# Patient Record
Sex: Male | Born: 1965 | Race: Black or African American | Hispanic: No | Marital: Married | Smoking: Former smoker
Health system: Southern US, Community
[De-identification: ages and names within clinical notes are randomized; demographics above are authoritative.]

## PROBLEM LIST (undated history)

## (undated) DIAGNOSIS — I639 Cerebral infarction, unspecified: Secondary | ICD-10-CM

## (undated) DIAGNOSIS — E785 Hyperlipidemia, unspecified: Secondary | ICD-10-CM

## (undated) DIAGNOSIS — I10 Essential (primary) hypertension: Secondary | ICD-10-CM

## (undated) HISTORY — DX: Cerebral infarction, unspecified: I63.9

## (undated) HISTORY — DX: Hyperlipidemia, unspecified: E78.5

## (undated) HISTORY — DX: Essential (primary) hypertension: I10

---

## 2007-09-27 ENCOUNTER — Emergency Department (HOSPITAL_COMMUNITY): Admission: EM | Admit: 2007-09-27 | Discharge: 2007-09-27 | Payer: Self-pay | Admitting: Family Medicine

## 2014-04-07 ENCOUNTER — Encounter (HOSPITAL_COMMUNITY): Payer: Self-pay | Admitting: Emergency Medicine

## 2014-04-07 ENCOUNTER — Emergency Department (HOSPITAL_COMMUNITY): Payer: BLUE CROSS/BLUE SHIELD

## 2014-04-07 ENCOUNTER — Inpatient Hospital Stay (HOSPITAL_COMMUNITY)
Admission: EM | Admit: 2014-04-07 | Discharge: 2014-04-11 | DRG: 069 | Disposition: A | Discharge status: Home / Self Care | Payer: BLUE CROSS/BLUE SHIELD | Attending: Neurology | Admitting: Neurology

## 2014-04-07 DIAGNOSIS — Z823 Family history of stroke: Secondary | ICD-10-CM | POA: Diagnosis not present

## 2014-04-07 DIAGNOSIS — Z8249 Family history of ischemic heart disease and other diseases of the circulatory system: Secondary | ICD-10-CM

## 2014-04-07 DIAGNOSIS — R030 Elevated blood-pressure reading, without diagnosis of hypertension: Secondary | ICD-10-CM

## 2014-04-07 DIAGNOSIS — Z833 Family history of diabetes mellitus: Secondary | ICD-10-CM | POA: Diagnosis not present

## 2014-04-07 DIAGNOSIS — IMO0001 Reserved for inherently not codable concepts without codable children: Secondary | ICD-10-CM | POA: Diagnosis present

## 2014-04-07 DIAGNOSIS — G451 Carotid artery syndrome (hemispheric): Secondary | ICD-10-CM | POA: Diagnosis not present

## 2014-04-07 DIAGNOSIS — Z720 Tobacco use: Secondary | ICD-10-CM | POA: Diagnosis present

## 2014-04-07 DIAGNOSIS — R4781 Slurred speech: Secondary | ICD-10-CM | POA: Diagnosis present

## 2014-04-07 DIAGNOSIS — G8194 Hemiplegia, unspecified affecting left nondominant side: Secondary | ICD-10-CM | POA: Diagnosis present

## 2014-04-07 DIAGNOSIS — I10 Essential (primary) hypertension: Secondary | ICD-10-CM | POA: Diagnosis present

## 2014-04-07 DIAGNOSIS — F17290 Nicotine dependence, other tobacco product, uncomplicated: Secondary | ICD-10-CM | POA: Diagnosis present

## 2014-04-07 DIAGNOSIS — G459 Transient cerebral ischemic attack, unspecified: Secondary | ICD-10-CM | POA: Diagnosis present

## 2014-04-07 DIAGNOSIS — I639 Cerebral infarction, unspecified: Secondary | ICD-10-CM | POA: Diagnosis present

## 2014-04-07 DIAGNOSIS — E785 Hyperlipidemia, unspecified: Secondary | ICD-10-CM | POA: Diagnosis present

## 2014-04-07 DIAGNOSIS — Z7982 Long term (current) use of aspirin: Secondary | ICD-10-CM | POA: Diagnosis not present

## 2014-04-07 DIAGNOSIS — R29810 Facial weakness: Secondary | ICD-10-CM | POA: Diagnosis present

## 2014-04-07 DIAGNOSIS — Z79899 Other long term (current) drug therapy: Secondary | ICD-10-CM

## 2014-04-07 LAB — COMPREHENSIVE METABOLIC PANEL
ALT: 20 U/L (ref 0–53)
AST: 24 U/L (ref 0–37)
Albumin: 3.8 g/dL (ref 3.5–5.2)
Alkaline Phosphatase: 63 U/L (ref 39–117)
Anion gap: 8 (ref 5–15)
BILIRUBIN TOTAL: 0.5 mg/dL (ref 0.3–1.2)
BUN: 10 mg/dL (ref 6–23)
CALCIUM: 8.9 mg/dL (ref 8.4–10.5)
CO2: 24 mmol/L (ref 19–32)
Chloride: 105 mmol/L (ref 96–112)
Creatinine, Ser: 1.09 mg/dL (ref 0.50–1.35)
GFR, EST AFRICAN AMERICAN: 90 mL/min — AB (ref >90)
GFR, EST NON AFRICAN AMERICAN: 77 mL/min — AB (ref >90)
GLUCOSE: 107 mg/dL — AB (ref 70–99)
Potassium: 3.9 mmol/L (ref 3.5–5.1)
Sodium: 137 mmol/L (ref 135–145)
TOTAL PROTEIN: 6.7 g/dL (ref 6.0–8.3)

## 2014-04-07 LAB — DIFFERENTIAL
BASOS PCT: 1 % (ref 0–1)
Basophils Absolute: 0.1 10*3/uL (ref 0.0–0.1)
Eosinophils Absolute: 0.1 10*3/uL (ref 0.0–0.7)
Eosinophils Relative: 2 % (ref 0–5)
LYMPHS PCT: 38 % (ref 12–46)
Lymphs Abs: 2.5 10*3/uL (ref 0.7–4.0)
MONO ABS: 0.5 10*3/uL (ref 0.1–1.0)
Monocytes Relative: 8 % (ref 3–12)
NEUTROS ABS: 3.3 10*3/uL (ref 1.7–7.7)
NEUTROS PCT: 51 % (ref 43–77)

## 2014-04-07 LAB — RAPID URINE DRUG SCREEN, HOSP PERFORMED
AMPHETAMINES: NOT DETECTED
BENZODIAZEPINES: NOT DETECTED
Barbiturates: NOT DETECTED
COCAINE: NOT DETECTED
Opiates: NOT DETECTED
Tetrahydrocannabinol: NOT DETECTED

## 2014-04-07 LAB — I-STAT CHEM 8, ED
BUN: 11 mg/dL (ref 6–23)
Calcium, Ion: 1.19 mmol/L (ref 1.12–1.23)
Chloride: 103 mmol/L (ref 96–112)
Creatinine, Ser: 1.1 mg/dL (ref 0.50–1.35)
Glucose, Bld: 102 mg/dL — ABNORMAL HIGH (ref 70–99)
HEMATOCRIT: 45 % (ref 39.0–52.0)
Hemoglobin: 15.3 g/dL (ref 13.0–17.0)
Potassium: 3.8 mmol/L (ref 3.5–5.1)
Sodium: 140 mmol/L (ref 135–145)
TCO2: 22 mmol/L (ref 0–100)

## 2014-04-07 LAB — CBC
HCT: 39.8 % (ref 39.0–52.0)
HEMOGLOBIN: 13 g/dL (ref 13.0–17.0)
MCH: 30 pg (ref 26.0–34.0)
MCHC: 32.7 g/dL (ref 30.0–36.0)
MCV: 91.7 fL (ref 78.0–100.0)
PLATELETS: 202 10*3/uL (ref 150–400)
RBC: 4.34 MIL/uL (ref 4.22–5.81)
RDW: 12.1 % (ref 11.5–15.5)
WBC: 6.6 10*3/uL (ref 4.0–10.5)

## 2014-04-07 LAB — I-STAT TROPONIN, ED: TROPONIN I, POC: 0.01 ng/mL (ref 0.00–0.08)

## 2014-04-07 LAB — SEDIMENTATION RATE: Sed Rate: 3 mm/hr (ref 0–16)

## 2014-04-07 LAB — APTT: aPTT: 40 seconds — ABNORMAL HIGH (ref 24–37)

## 2014-04-07 LAB — PROTIME-INR
INR: 0.94 (ref 0.00–1.49)
PROTHROMBIN TIME: 12.6 s (ref 11.6–15.2)

## 2014-04-07 LAB — CBG MONITORING, ED: GLUCOSE-CAPILLARY: 86 mg/dL (ref 70–99)

## 2014-04-07 MED ORDER — HEPARIN SODIUM (PORCINE) 5000 UNIT/ML IJ SOLN
5000.0000 [IU] | Freq: Three times a day (TID) | INTRAMUSCULAR | Status: DC
  Administered 2014-04-07 – 2014-04-08 (×3): 5000 [IU] via SUBCUTANEOUS
  Filled 2014-04-07 (×4): qty 1

## 2014-04-07 MED ORDER — ASPIRIN 300 MG RE SUPP: 300.0000 mg | Freq: Every day | RECTAL | Status: DC

## 2014-04-07 MED ORDER — ASPIRIN 325 MG PO TABS
325.0000 mg | ORAL_TABLET | Freq: Every day | ORAL | Status: DC
  Administered 2014-04-07 – 2014-04-08 (×2): 325 mg via ORAL
  Filled 2014-04-07 (×2): qty 1

## 2014-04-07 MED ORDER — SENNOSIDES-DOCUSATE SODIUM 8.6-50 MG PO TABS
1.0000 | ORAL_TABLET | Freq: Every evening | ORAL | Status: DC | PRN
  Filled 2014-04-07: qty 1

## 2014-04-07 MED ORDER — NICOTINE 21 MG/24HR TD PT24
21.0000 mg | MEDICATED_PATCH | Freq: Every day | TRANSDERMAL | Status: DC
  Filled 2014-04-07 (×4): qty 1

## 2014-04-07 MED ORDER — ASPIRIN 81 MG PO CHEW: 324.0000 mg | CHEWABLE_TABLET | Freq: Once | ORAL | Status: DC

## 2014-04-07 MED ORDER — SODIUM CHLORIDE 0.9 % IV SOLN
INTRAVENOUS | Status: DC
  Administered 2014-04-07: 22:00:00 via INTRAVENOUS

## 2014-04-07 MED ORDER — STROKE: EARLY STAGES OF RECOVERY BOOK
Freq: Once | Status: AC
  Administered 2014-04-07: 22:00:00

## 2014-04-07 MED ORDER — ATORVASTATIN CALCIUM 40 MG PO TABS
40.0000 mg | ORAL_TABLET | Freq: Every day | ORAL | Status: DC
  Administered 2014-04-07 – 2014-04-10 (×4): 40 mg via ORAL
  Filled 2014-04-07 (×5): qty 1

## 2014-04-07 NOTE — ED Notes (Addendum)
Per EMS patient called EMS due to left sided weakness and numbness at work. States left facial droop with drooling and slurred speech. Patient stats left sided weakness with numbness and tingling.   BP 168 118 HR 80 RR 16 CBG 101 SpO2 98  Patient states he took 325mg  of Aspirin just before EMS arrived.

## 2014-04-07 NOTE — Consult Note (Signed)
Referring Physician: Linker    Chief Complaint: Left sided numbness and weakness  HPI: Charles Harding is an 49 y.o. male who reports that he was at work today.  Acutely noted tingling in his left arm and leg.  His gait was difficult.  He decided he should go home early and went to his car.  Noted that he had tingling on the left side of his face as well and was drooling from the left side of his mouth.  He as unable to operate his car.  He asked someone to call EMS who brought him in as a code stoke.  Patient began to improve en route.  Initial NIHSS of 1  Date last known well: Date: 04/07/2014 Time last known well: Time: 16:30 tPA Given: No: Improvement in symptoms  Past medical history: None  Past surgical history: None  Family history:  Patient has no information concerning his father.  His mother has HTN and has had a stroke  Social History:  The patient smokes cigars.  Drinks socially.  Has no history of illicit drug abuse.    Allergies: No Known Allergies  Medications: None  ROS: History obtained from the patient  General ROS: negative for - chills, fatigue, fever, night sweats, weight gain or weight loss Psychological ROS: negative for - behavioral disorder, hallucinations, memory difficulties, mood swings or suicidal ideation Ophthalmic ROS: negative for - blurry vision, double vision, eye pain or loss of vision ENT ROS: negative for - epistaxis, nasal discharge, oral lesions, sore throat, tinnitus or vertigo Allergy and Immunology ROS: negative for - hives or itchy/watery eyes Hematological and Lymphatic ROS: negative for - bleeding problems, bruising or swollen lymph nodes Endocrine ROS: negative for - galactorrhea, hair pattern changes, polydipsia/polyuria or temperature intolerance Respiratory ROS: negative for - cough, hemoptysis, shortness of breath or wheezing Cardiovascular ROS: negative for - chest pain, dyspnea on exertion, edema or irregular  heartbeat Gastrointestinal ROS: negative for - abdominal pain, diarrhea, hematemesis, nausea/vomiting or stool incontinence Genito-Urinary ROS: negative for - dysuria, hematuria, incontinence or urinary frequency/urgency Musculoskeletal ROS: negative for - joint swelling or muscular weakness Neurological ROS: as noted in HPI Dermatological ROS: negative for rash and skin lesion changes  Physical Examination: Blood pressure 168/98, pulse 82, temperature 98.9 F (37.2 C), temperature source Oral, resp. rate 18, height 6' (1.829 m), weight 66.679 kg (147 lb), SpO2 97 %.  HEENT-  Normocephalic, no lesions, without obvious abnormality.  Normal external eye and conjunctiva.  Normal TM's bilaterally.  Normal auditory canals and external ears. Normal external nose, mucus membranes and septum.  Normal pharynx. Cardiovascular- S1, S2 normal, pulses palpable throughout   Lungs- chest clear, no wheezing, rales, normal symmetric air entry Abdomen- soft, non-tender; bowel sounds normal; no masses,  no organomegaly Extremities- no edema Lymph-no adenopathy palpable Musculoskeletal-no joint tenderness, deformity or swelling Skin-warm and dry, no hyperpigmentation, vitiligo, or suspicious lesions  Neurological Examination Mental Status: Alert, oriented, thought content appropriate.  Speech fluent without evidence of aphasia.  Able to follow 3 step commands without difficulty. Cranial Nerves: II: Discs flat bilaterally; Visual fields grossly normal, pupils equal, round, reactive to light and accommodation III,IV, VI: ptosis not present, extra-ocular motions intact bilaterally V,VII: smile symmetric, facial light touch sensation normal bilaterally VIII: hearing normal bilaterally IX,X: gag reflex present XI: bilateral shoulder shrug XII: midline tongue extension Motor: Right : Upper extremity   5/5    Left:     Upper extremity   5/5  Lower extremity  5/5     Lower extremity   5/5 Tone and bulk:normal  tone throughout; no atrophy noted Sensory: Mild decrease in sensation in the lower portion of the left arm Deep Tendon Reflexes: 2+ and symmetric throughout Plantars: Right: downgoing   Left: downgoing Cerebellar: normal finger-to-nose and normal heel-to-shin testing bilaterally Gait: Unable to test      Laboratory Studies:  Basic Metabolic Panel:  Recent Labs Lab 04/07/14 1736 04/07/14 1742  NA 137 140  K 3.9 3.8  CL 105 103  CO2 24  --   GLUCOSE 107* 102*  BUN 10 11  CREATININE 1.09 1.10  CALCIUM 8.9  --     Liver Function Tests:  Recent Labs Lab 04/07/14 1736  AST 24  ALT 20  ALKPHOS 63  BILITOT 0.5  PROT 6.7  ALBUMIN 3.8   No results for input(s): LIPASE, AMYLASE in the last 168 hours. No results for input(s): AMMONIA in the last 168 hours.  CBC:  Recent Labs Lab 04/07/14 1736 04/07/14 1742  WBC 6.6  --   NEUTROABS 3.3  --   HGB 13.0 15.3  HCT 39.8 45.0  MCV 91.7  --   PLT 202  --     Cardiac Enzymes: No results for input(s): CKTOTAL, CKMB, CKMBINDEX, TROPONINI in the last 168 hours.  BNP: Invalid input(s): POCBNP  CBG: No results for input(s): GLUCAP in the last 168 hours.  Microbiology: No results found for this or any previous visit.  Coagulation Studies:  Recent Labs  04/07/14 1736  LABPROT 12.6  INR 0.94    Urinalysis: No results for input(s): COLORURINE, LABSPEC, PHURINE, GLUCOSEU, HGBUR, BILIRUBINUR, KETONESUR, PROTEINUR, UROBILINOGEN, NITRITE, LEUKOCYTESUR in the last 168 hours.  Invalid input(s): APPERANCEUR  Lipid Panel: No results found for: CHOL, TRIG, HDL, CHOLHDL, VLDL, LDLCALC  HgbA1C: No results found for: HGBA1C  Urine Drug Screen:  No results found for: LABOPIA, COCAINSCRNUR, LABBENZ, AMPHETMU, THCU, LABBARB  Alcohol Level: No results for input(s): ETH in the last 168 hours.   Other results: EKG: sinus rhythm at 81 bpm; LAE  Imaging: Ct Head (brain) Wo Contrast  04/07/2014   CLINICAL DATA:  Code  stroke.  TIA symptoms now resolving.  EXAM: CT HEAD WITHOUT CONTRAST  TECHNIQUE: Contiguous axial images were obtained from the base of the skull through the vertex without intravenous contrast.  COMPARISON:  None.  FINDINGS: Ventricles and sulci appear symmetrical. No mass effect or midline shift. No abnormal extra-axial fluid collections. Gray-white matter junctions are distinct. Basal cisterns are not effaced. No evidence of acute intracranial hemorrhage. No depressed skull fractures. Visualized paranasal sinuses and mastoid air cells are not opacified.  IMPRESSION: No acute intracranial abnormalities.  Normal examination.  These results were called by telephone at the time of interpretation on 04/07/2014 at 5:54 pm to Dr. Alfonzo Beers , who verbally acknowledged these results.   Electronically Signed   By: Lucienne Capers M.D.   On: 04/07/2014 17:55    Assessment: 49 y.o. male presenting with complaints of left sided numbness and weakness.  Symptoms improved en route.  Symptoms now minimal on neurological examination.  Head CT personally reviewed and shows no acute changes.  TIA remains on the differential.  Patient on no antiplatelet therapy at home.    Stroke Risk Factors - smoking  Plan: 1. HgbA1c, fasting lipid panel, ESR, UDS 2. MRI, MRA  of the brain without contrast 3. PT consult, OT consult, Speech consult 4. Echocardiogram 5. Carotid dopplers 6. Prophylactic therapy-Antiplatelet  med: Aspirin - dose 362m daily 7. NPO until RN stroke swallow screen 8. Telemetry monitoring 9. Frequent neuro checks 10. EEG  Case discussed with Dr.Linker  LAlexis Goodell MD [facility] [phone]/24/2016, 6:28 PM

## 2014-04-07 NOTE — ED Provider Notes (Signed)
CSN: [id number]     Arrival date & time 04/07/14  1735 History   First MD Initiated Contact with Patient 04/07/14 1748     Chief Complaint  Patient presents with  . Code Stroke     (Consider location/radiation/quality/duration/timing/severity/associated sxs/prior Treatment) HPI  Pt presenting with c/o left sided arm weakness and numbness, also some facial droop and slurred speech.  Symptoms occurred while at work.  Upon arrival via EMS symptoms had mostly resolved, he continued to have some mild numnbess of his left hand.  No headache.  No recent illness.  Pt took 325 mg aspirin at work prior to EMS arrival.  Symptoms were moderate in severity.  Pt arrived as a code stroke notification.  There are no other associated systemic symptoms, there are no other alleviating or modifying factors.   History reviewed. No pertinent past medical history. History reviewed. No pertinent past surgical history. Family History  Problem Relation Age of Onset  . Diabetes Mother   . Hypertension Mother   . Stroke Mother    History  Substance Use Topics  . Smoking status: Light Tobacco Smoker -- 0.25 packs/day    Types: Cigars  . Smokeless tobacco: Not on file  . Alcohol Use: Yes     Comment: occ    Review of Systems  ROS reviewed and all otherwise negative except for mentioned in HPI    Allergies  Review of patient's allergies indicates no known allergies.  Home Medications   Prior to Admission medications   Not on File   BP 168/106 mmHg  Pulse 83  Temp(Src) 97.5 F (36.4 C) (Oral)  Resp 16  Ht 6' (1.829 m)  Wt 147 lb (66.679 kg)  BMI 19.93 kg/m2  SpO2 96%  Vitals reviewed Physical Exam  Physical Examination: General appearance - alert, well appearing, and in no distress Mental status - alert, oriented to person, place, and time Eyes - pupils equal and reactive, extraocular eye movements intact Mouth - mucous membranes moist, pharynx normal without lesions Neck - supple, no  significant adenopathy Chest - clear to auscultation, no wheezes, rales or rhonchi, symmetric air entry Heart - normal rate, regular rhythm, normal S1, S2, no murmurs, rubs, clicks or gallops Neurological - alert, oriented x 3, cranial nerves 2-12 tested and intact, strength 5/5 in ext x 4, sensation decreased to light touch over left hand- othewise sensation intact Extremities - peripheral pulses normal, no pedal edema, no clubbing or cyanosis Skin - normal coloration and turgor, no rashes  ED Course  Procedures (including critical care time)  7:36 PM d/w Dr. Clyde Lundborg, [facility]- pt to go to telemetry bed, temp holding orders written.   Labs Review Labs Reviewed  APTT - Abnormal; Notable for the following:    aPTT 40 (*)    All other components within normal limits  COMPREHENSIVE METABOLIC PANEL - Abnormal; Notable for the following:    Glucose, Bld 107 (*)    GFR calc non Af Amer 77 (*)    GFR calc Af Amer 90 (*)    All other components within normal limits  LIPID PANEL - Abnormal; Notable for the following:    Cholesterol 240 (*)    LDL Cholesterol 180 (*)    All other components within normal limits  I-STAT CHEM 8, ED - Abnormal; Notable for the following:    Glucose, Bld 102 (*)    All other components within normal limits  MRSA PCR SCREENING  PROTIME-INR  CBC  DIFFERENTIAL  URINE RAPID DRUG SCREEN (HOSP PERFORMED)  SEDIMENTATION RATE  HEMOGLOBIN A1C  CBG MONITORING, ED  I-STAT TROPOININ, ED    Imaging Review Ct Head Wo Contrast  04/10/2014   CLINICAL DATA:  Recent CVA with tPA administration  EXAM: CT HEAD WITHOUT CONTRAST  TECHNIQUE: Contiguous axial images were obtained from the base of the skull through the vertex without intravenous contrast.  COMPARISON:  04/09/2014  FINDINGS: Bony calvarium is intact. No findings to suggest acute hemorrhage, acute infarction or space-occupying mass lesion are noted. No significant interval change from the prior exam is seen.   IMPRESSION: No acute abnormality noted. No significant change from the prior study.   Electronically Signed   By: Alcide Clever M.D.   On: 04/10/2014 06:57   Ct Head Wo Contrast  04/09/2014   CLINICAL DATA:  LEFT-sided facial drooping, garbled speech, LEFT-sided flaccidity beginning at 0245 hours tonight. History of hypertension and tobacco use.  EXAM: CT HEAD WITHOUT CONTRAST  TECHNIQUE: Contiguous axial images were obtained from the base of the skull through the vertex without intravenous contrast.  COMPARISON:  MRI of the brain April 07, 2014 at 1944 hours  FINDINGS: The ventricles and sulci are normal. No intraparenchymal hemorrhage, mass effect nor midline shift. No acute large vascular territory infarcts. Patchy supratentorial white matter hypodensities, advanced for age though, stable from prior imaging.  No abnormal extra-axial fluid collections. Basal cisterns are patent. Mild calcific atherosclerosis of the carotid siphons.  No skull fracture. The included ocular globes and orbital contents are non-suspicious. The mastoid aircells and included paranasal sinuses are well-aerated.  IMPRESSION: No acute intracranial process.  Mild to moderate white matter changes, advanced for age, better characterized on MRI of the brain from yesterday.   Electronically Signed   By: Awilda Metro   On: 04/09/2014 03:51   Mr Brain Wo Contrast  04/09/2014   CLINICAL DATA:  Recurrent LEFT-sided weakness and facial droop at 0200 hours, symptoms resolved after 10 minutes, symptoms recurred and subsequently resolved again.  EXAM: MRI HEAD WITHOUT CONTRAST  TECHNIQUE: Multiplanar, multiecho pulse sequences of the brain and surrounding structures were obtained without intravenous contrast.  COMPARISON:  CT of the head April 09, 2014 at 3:23 a.m. and MRI of the brain April 07, 2014  FINDINGS: Mild motion degraded examination.  The ventricles and sulci are normal for patient's age. No abnormal parenchymal signal,  mass lesions, mass effect. Faint reduced diffusion within RIGHT caudate body. Faint suspected low ADC values, faint T2 hyperintense signal. No susceptibility artifact to suggest hemorrhage. Similar patchy supratentorial white matter T2 hyperintensities, advanced for age.  No abnormal extra-axial fluid collections. No extra-axial masses though, contrast enhanced sequences would be more sensitive. Normal major intracranial vascular flow voids seen at the skull base.  Ocular globes and orbital contents are unremarkable though not tailored for evaluation. No abnormal sellar expansion. Visualized paranasal sinuses and mastoid air cells are well-aerated. No suspicious calvarial bone marrow signal. No abnormal sellar expansion. Craniocervical junction maintained.  IMPRESSION: Faint reduced diffusion RIGHT caudate body concerning for acute ischemia, less likely ex hyperacute demyelination.  Advanced white matter changes for age, nonspecific and may reflect sequelae of chronic hypertension, migraine ; atypical distribution for demyelination.   Electronically Signed   By: Awilda Metro   On: 04/09/2014 05:19     EKG Interpretation   Date/Time:  Wednesday April 07 2014 17:55:05 EST Ventricular Rate:  81 PR Interval:  138 QRS Duration: 78 QT Interval:  349 QTC Calculation:  405 R Axis:   75 Text Interpretation:  Sinus rhythm Left atrial enlargement RSR' in V1 or  V2, probably normal variant No old tracing to compare Confirmed by [facility] Center[facility]   MD, Torell Minder [phone]) on 04/07/2014 10:35:25 PM      MDM   Final diagnoses:  TIA (transient ischemic attack)  Transient cerebral ischemia, unspecified transient cerebral ischemia type  Elevated blood pressure  Tobacco abuse  Slurred speech  Stroke    Pt presenting as code stroke notification- symptoms mostly resolved prior to arrival- left hand numbness persists.  Pt seen by neurology team and myself upon arrival.  As symptoms resolving no indication for TPA per  stroke team.  CT reassuring.  Pt admitted to medicine for further CVA/TIA workup.     Ethelda ChickMartha K Linker, MD 04/10/14 [phone]

## 2014-04-07 NOTE — ED Notes (Signed)
CBG 86. 

## 2014-04-07 NOTE — H&P (Signed)
[facility] History and Physical  Damaris Geers [id number]:[id number] DOB: Jun 28, 1965 DOA: 04/07/2014  Referring physician: ED physician PCP: No primary care provider on file.  Specialists:   Chief Complaint: left side tingling  HPI: Nizar Cutler is a 49 y.o. male with PMH of smoking, who presents with left side tingling.   Patient reports that he noted tingling in his left arm, leg and face when he was at work at about 4:00 PM today. He also has gait difficulty. He decided to go home early and went to his car, but was unable to operate his car. He asked someone to call EMS who brought him in as a code stoke. His symptoms began to improve en route to hospital. Patient's symptoms have completely resolved when I evaluate the patient in emergency room. Patient denies fever, chills, cough, chest pain, SOB, abdominal pain, diarrhea, constipation, dysuria, urgency, frequency, hematuria, skin rashes, joint pain or leg swelling.   In ED, patient was found to have negative CT-head for acute issues. EKG showed LAE. Patient is admitted to inpatient for further evaluation and treatment.  Review of Systems: As presented in the history of presenting illness, rest negative.  Where does patient live? At home   Can patient participate in ADLs? Yes  Allergy: No Known Allergies  History reviewed. No pertinent past medical history.  History reviewed. No pertinent past surgical history.  Social History:  reports that he has been smoking Cigars.  He does not have any smokeless tobacco history on file. He reports that he drinks alcohol. He reports that he does not use illicit drugs.  Family History:  Family History  Problem Relation Age of Onset  . Diabetes Mother   . Hypertension Mother   . Stroke Mother      Prior to Admission medications   Not on File    Physical Exam: Filed Vitals:   04/07/14 1845 04/07/14 1900 04/07/14 1905 04/07/14 1905  BP: 172/111 169/107 169/107   Pulse: 73 78 82    Temp:      TempSrc:      Resp:   20 18  Height:      Weight:      SpO2: 98% 98% 99%    General: Not in acute distress HEENT:       Eyes: PERRL, EOMI, no scleral icterus       ENT: No discharge from the ears and nose, no pharynx injection, no tonsillar enlargement.        Neck: No JVD, no bruit, no mass felt. Cardiac: S1/S2, RRR, No murmurs, No gallops or rubs Pulm: Good air movement bilaterally. Clear to auscultation bilaterally. No rales, wheezing, rhonchi or rubs. Abd: Soft, nondistended, nontender, no rebound pain, no organomegaly, BS present Ext: No edema bilaterally. 2+DP/PT pulse bilaterally Musculoskeletal: No joint deformities, erythema, or stiffness, ROM full Skin: No rashes.  Neuro: Alert and oriented X3, cranial nerves II-XII grossly intact, muscle strength 5/5 in all extremeties, sensation to light touch intact. Brachial reflex 2+ bilaterally. Knee reflex 1+ bilaterally. Negative Babinski's sign. Normal finger to nose test. Psych: Patient is not psychotic, no suicidal or hemocidal ideation.  Labs on Admission:  Basic Metabolic Panel:  Recent Labs Lab 04/07/14 1736 04/07/14 1742  NA 137 140  K 3.9 3.8  CL 105 103  CO2 24  --   GLUCOSE 107* 102*  BUN 10 11  CREATININE 1.09 1.10  CALCIUM 8.9  --    Liver Function Tests:  Recent Labs Lab 04/07/14 1736  AST 24  ALT 20  ALKPHOS 63  BILITOT 0.5  PROT 6.7  ALBUMIN 3.8   No results for input(s): LIPASE, AMYLASE in the last 168 hours. No results for input(s): AMMONIA in the last 168 hours. CBC:  Recent Labs Lab 04/07/14 1736 04/07/14 1742  WBC 6.6  --   NEUTROABS 3.3  --   HGB 13.0 15.3  HCT 39.8 45.0  MCV 91.7  --   PLT 202  --    Cardiac Enzymes: No results for input(s): CKTOTAL, CKMB, CKMBINDEX, TROPONINI in the last 168 hours.  BNP (last 3 results) No results for input(s): BNP in the last 8760 hours.  ProBNP (last 3 results) No results for input(s): PROBNP in the last 8760  hours.  CBG:  Recent Labs Lab 04/07/14 1826  GLUCAP 86    Radiological Exams on Admission: Ct Head (brain) Wo Contrast  04/07/2014   CLINICAL DATA:  Code stroke.  TIA symptoms now resolving.  EXAM: CT HEAD WITHOUT CONTRAST  TECHNIQUE: Contiguous axial images were obtained from the base of the skull through the vertex without intravenous contrast.  COMPARISON:  None.  FINDINGS: Ventricles and sulci appear symmetrical. No mass effect or midline shift. No abnormal extra-axial fluid collections. Gray-white matter junctions are distinct. Basal cisterns are not effaced. No evidence of acute intracranial hemorrhage. No depressed skull fractures. Visualized paranasal sinuses and mastoid air cells are not opacified.  IMPRESSION: No acute intracranial abnormalities.  Normal examination.  These results were called by telephone at the time of interpretation on 04/07/2014 at 5:54 pm to Dr. Alfonzo Beers , who verbally acknowledged these results.   Electronically Signed   By: Lucienne Capers M.D.   On: 04/07/2014 17:55    EKG: Independently reviewed. LAE and LVH  Assessment/Plan Principal Problem:   TIA (transient ischemic attack) Active Problems:   Tobacco abuse   Elevated blood pressure  1. TIA: patient's presentation is likely due to TIA, or stroke. No signs of infection. CT-head negative. Neurology was consulted, Dr. Tyron Russell evaluated pt, recommended TIA work up.  - will admit to tele bed  - Appreciate Dr. Regenia Skeeter consultation, will follow up recommendations as follows: 1. HgbA1c, fasting lipid panel, ESR, UDS 2. MRI, MRA  of the brain without contrast 3. PT consult, OT consult, Speech consult 4. Echocardiogram 5. Carotid dopplers 6. Prophylactic therapy-Antiplatelet med: Aspirin - dose 356m daily 7. NPO until RN stroke swallow screen 8. Telemetry monitoring 9. Frequent neuro checks 10. EEG -will start lipitor 40 mg daily  2. Tobacco abuse: -Nicotine patch -did counseling about the  importance of qutting smoking.  3. Elevated bp: patient likely to have undiagnosed hypertension. -will not hold bp med now in the setting of acute TIA   DVT ppx: SQ Heparin    Code Status: Full code Family Communication:   Yes, patient's  2 sisters and one brother     at bed side Disposition Plan: Admit to inpatient   Date of Service 04/07/2014    NIvor CostaTri[facility]ists Pager [phone] If 7PM-7AM, please contact night-coverage [email or web address] Password TManchester [facility]2/24/2016, 8:19 PM

## 2014-04-07 NOTE — Code Documentation (Signed)
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  He stated that he 1st noted some numbness in his arm and face then noticed some drooling from his mouth.  He went to his vehicle, but had difficulty getting in.  He was LKW at 1630.  EMS was called.  Code Stroke called en route at 1727. His symptoms began to improve.  He arrived at the hospital at 1734, labs drawn, stat head CT done.  NIHSS 1, sensory decreased on left arm.  Dr Thad Rangereynolds at bedside to assess patient.  RN to continue frequent VS and neuro checks, pt remains in the TPA window until 2100.  RN to call if symptoms worsen.

## 2014-04-08 ENCOUNTER — Inpatient Hospital Stay (HOSPITAL_COMMUNITY): Payer: BLUE CROSS/BLUE SHIELD

## 2014-04-08 ENCOUNTER — Encounter (HOSPITAL_COMMUNITY): Payer: Self-pay | Admitting: *Deleted

## 2014-04-08 DIAGNOSIS — G459 Transient cerebral ischemic attack, unspecified: Secondary | ICD-10-CM

## 2014-04-08 DIAGNOSIS — R030 Elevated blood-pressure reading, without diagnosis of hypertension: Secondary | ICD-10-CM

## 2014-04-08 DIAGNOSIS — G451 Carotid artery syndrome (hemispheric): Secondary | ICD-10-CM

## 2014-04-08 LAB — LIPID PANEL
CHOL/HDL RATIO: 5.6 ratio
Cholesterol: 240 mg/dL — ABNORMAL HIGH (ref 0–200)
HDL: 43 mg/dL (ref >39)
LDL Cholesterol: 180 mg/dL — ABNORMAL HIGH (ref 0–99)
Triglycerides: 86 mg/dL (ref <150)
VLDL: 17 mg/dL (ref 0–40)

## 2014-04-08 NOTE — Progress Notes (Signed)
OT Cancellation Note  Patient Details Name: Charles Harding MRN: [id number] DOB: 03/18/1965   Cancelled Treatment:    Reason Eval/Treat Not Completed: Other (comment) (bed rest order)Will reattempt OT evaluation when patient's activity order increased.  Pilar GrammesMathews, Saide Lanuza H 04/08/2014, 8:58 AM

## 2014-04-08 NOTE — Progress Notes (Signed)
  Echocardiogram 2D Echocardiogram has been performed.  Clearence PedCrouch, Aiya Keach M 04/08/2014, 12:09 PM

## 2014-04-08 NOTE — Progress Notes (Signed)
VASCULAR LAB PRELIMINARY  PRELIMINARY  PRELIMINARY  PRELIMINARY  Carotid duplex completed.    Preliminary report:  Bilateral:  1-39% ICA stenosis.  Vertebral artery flow is antegrade.     Aracelly Tencza, RVS 04/08/2014, 12:41 PM

## 2014-04-08 NOTE — Progress Notes (Signed)
Subjective: Patient reports that he is back to baseline.  No further paresthesias or weakness.  BP remains elevated.    Objective: Current vital signs: BP 173/101 mmHg  Pulse 74  Temp(Src) 98.2 F (36.8 C) (Oral)  Resp 18  Ht 6' (1.829 m)  Wt 66.679 kg (147 lb)  BMI 19.93 kg/m2  SpO2 95% Vital signs in last 24 hours: Temp:  [98.1 F (36.7 C)-99.1 F (37.3 C)] 98.2 F (36.8 C) (02/25 1100) Pulse Rate:  [66-85] 74 (02/25 1100) Resp:  [14-20] 18 (02/25 1100) BP: (143-183)/(84-117) 173/101 mmHg (02/25 1339) SpO2:  [95 %-100 %] 95 % (02/25 1100) Weight:  [66.679 kg (147 lb)] 66.679 kg (147 lb) (02/24 1809)  Intake/Output from previous day: 02/24 0701 - 02/25 0700 In: 1220 [P.O.:720; I.V.:500] Out: 950 [Urine:950] Intake/Output this shift: Total I/O In: 120 [P.O.:120] Out: -  Nutritional status: Diet regular  Neurological Examination Mental Status: Alert, oriented, thought content appropriate.  Speech fluent without evidence of aphasia.  Able to follow 3 step commands without difficulty. Cranial Nerves: II: Discs flat bilaterally; Visual fields grossly normal, pupils equal, round, reactive to light and accommodation III,IV, VI: ptosis not present, extra-ocular motions intact bilaterally V,VII: smile symmetric, facial light touch sensation normal bilaterally VIII: hearing normal bilaterally IX,X: gag reflex present XI: bilateral shoulder shrug XII: midline tongue extension Motor: Right : Upper extremity   5/5    Left:     Upper extremity   5/5  Lower extremity   5/5     Lower extremity   5/5 Tone and bulk:normal tone throughout; no atrophy noted Sensory: Pinprick and light touch intact throughout, bilaterally Deep Tendon Reflexes: 2+ and symmetric throughout Plantars: Right: downgoing   Left: downgoing Cerebellar: normal finger-to-nose and normal heel-to-shin testing bilaterally   Lab Results: Basic Metabolic Panel:  Recent Labs Lab 04/07/14 1736 04/07/14 1742   NA 137 140  K 3.9 3.8  CL 105 103  CO2 24  --   GLUCOSE 107* 102*  BUN 10 11  CREATININE 1.09 1.10  CALCIUM 8.9  --     Liver Function Tests:  Recent Labs Lab 04/07/14 1736  AST 24  ALT 20  ALKPHOS 63  BILITOT 0.5  PROT 6.7  ALBUMIN 3.8   No results for input(s): LIPASE, AMYLASE in the last 168 hours. No results for input(s): AMMONIA in the last 168 hours.  CBC:  Recent Labs Lab 04/07/14 1736 04/07/14 1742  WBC 6.6  --   NEUTROABS 3.3  --   HGB 13.0 15.3  HCT 39.8 45.0  MCV 91.7  --   PLT 202  --     Cardiac Enzymes: No results for input(s): CKTOTAL, CKMB, CKMBINDEX, TROPONINI in the last 168 hours.  Lipid Panel:  Recent Labs Lab 04/08/14 0404  CHOL 240*  TRIG 86  HDL 43  CHOLHDL 5.6  VLDL 17  LDLCALC 180*    CBG:  Recent Labs Lab 04/07/14 1826  GLUCAP 86    Microbiology: No results found for this or any previous visit.  Coagulation Studies:  Recent Labs  04/07/14 1736  LABPROT 12.6  INR 0.94    Imaging: Ct Head (brain) Wo Contrast  04/07/2014   CLINICAL DATA:  Code stroke.  TIA symptoms now resolving.  EXAM: CT HEAD WITHOUT CONTRAST  TECHNIQUE: Contiguous axial images were obtained from the base of the skull through the vertex without intravenous contrast.  COMPARISON:  None.  FINDINGS: Ventricles and sulci appear symmetrical. No mass effect  or midline shift. No abnormal extra-axial fluid collections. Gray-white matter junctions are distinct. Basal cisterns are not effaced. No evidence of acute intracranial hemorrhage. No depressed skull fractures. Visualized paranasal sinuses and mastoid air cells are not opacified.  IMPRESSION: No acute intracranial abnormalities.  Normal examination.  These results were called by telephone at the time of interpretation on 04/07/2014 at 5:54 pm to Dr. Alfonzo Beers , who verbally acknowledged these results.   Electronically Signed   By: Lucienne Capers M.D.   On: 04/07/2014 17:55   Mr Brain Wo  Contrast  04/07/2014   CLINICAL DATA:  Acute onset of tingling sensation in the left arm and leg with gait abnormality. Tingling sensation in the face and drooling from left side of the mouth. The symptoms have since resolved.  EXAM: MRI HEAD WITHOUT CONTRAST  MRA HEAD WITHOUT CONTRAST  TECHNIQUE: Multiplanar, multiecho pulse sequences of the brain and surrounding structures were obtained without intravenous contrast. Angiographic images of the head were obtained using MRA technique without contrast.  COMPARISON:  CT head from the same day.  FINDINGS: MRI HEAD FINDINGS  The diffusion-weighted images demonstrate no evidence for acute or subacute infarction.  Multiple subcortical T2 hyperintensities are present bilaterally. T2 changes are evident along the colossal septal margin bilaterally.  No acute hemorrhage or mass lesion is present. No significant white matter lesions are present within the posterior fossa.  Flow is present in the major intracranial arteries. The globes and orbits are intact. Mild mucosal thickening is present in the anterior ethmoid air cells on the left. There is some fluid in the mastoid air cells, right greater than left. No obstructing nasopharyngeal lesion is evident.  MRA HEAD FINDINGS  The internal carotid arteries are within normal limits from the high cervical segments through the ICA termini bilaterally. The A1 and M1 segments are normal. The anterior communicating artery is patent. The A1 and M1 segments are normal. The MCA bifurcations are within normal limits. ACA and MCA branch vessels are normal.  The vertebral arteries are codominant. The left PICA origin is visualized and normal. The right AICA is dominant. The basilar artery is normal. Both posterior cerebral arteries originate from the basilar tip. A prominent right posterior communicating artery is present as well. The PCA branch vessels are within normal limits.  IMPRESSION: 1. Multiple periventricular and subcortical T2  hyperintensities bilaterally. These are greater than expected for age. The finding is nonspecific but can be seen in the setting of chronic microvascular ischemia, a demyelinating process such as multiple sclerosis, vasculitis, complicated migraine headaches, or as the sequelae of a prior infectious or inflammatory process. 2. No acute intracranial abnormality. Specifically no evidence for acute infarct. 3. Normal MRA circle of Willis without evidence for significant proximal stenosis, aneurysm, or branch vessel occlusion.   Electronically Signed   By: San Morelle M.D.   On: 04/07/2014 20:22   Mr Jodene Nam Head/brain Wo Cm  04/07/2014   CLINICAL DATA:  Acute onset of tingling sensation in the left arm and leg with gait abnormality. Tingling sensation in the face and drooling from left side of the mouth. The symptoms have since resolved.  EXAM: MRI HEAD WITHOUT CONTRAST  MRA HEAD WITHOUT CONTRAST  TECHNIQUE: Multiplanar, multiecho pulse sequences of the brain and surrounding structures were obtained without intravenous contrast. Angiographic images of the head were obtained using MRA technique without contrast.  COMPARISON:  CT head from the same day.  FINDINGS: MRI HEAD FINDINGS  The diffusion-weighted images demonstrate  no evidence for acute or subacute infarction.  Multiple subcortical T2 hyperintensities are present bilaterally. T2 changes are evident along the colossal septal margin bilaterally.  No acute hemorrhage or mass lesion is present. No significant white matter lesions are present within the posterior fossa.  Flow is present in the major intracranial arteries. The globes and orbits are intact. Mild mucosal thickening is present in the anterior ethmoid air cells on the left. There is some fluid in the mastoid air cells, right greater than left. No obstructing nasopharyngeal lesion is evident.  MRA HEAD FINDINGS  The internal carotid arteries are within normal limits from the high cervical segments  through the ICA termini bilaterally. The A1 and M1 segments are normal. The anterior communicating artery is patent. The A1 and M1 segments are normal. The MCA bifurcations are within normal limits. ACA and MCA branch vessels are normal.  The vertebral arteries are codominant. The left PICA origin is visualized and normal. The right AICA is dominant. The basilar artery is normal. Both posterior cerebral arteries originate from the basilar tip. A prominent right posterior communicating artery is present as well. The PCA branch vessels are within normal limits.  IMPRESSION: 1. Multiple periventricular and subcortical T2 hyperintensities bilaterally. These are greater than expected for age. The finding is nonspecific but can be seen in the setting of chronic microvascular ischemia, a demyelinating process such as multiple sclerosis, vasculitis, complicated migraine headaches, or as the sequelae of a prior infectious or inflammatory process. 2. No acute intracranial abnormality. Specifically no evidence for acute infarct. 3. Normal MRA circle of Willis without evidence for significant proximal stenosis, aneurysm, or branch vessel occlusion.   Electronically Signed   By: San Morelle M.D.   On: 04/07/2014 20:22    Medications:  I have reviewed the patient's current medications. Scheduled: . aspirin  300 mg Rectal Daily   Or  . aspirin  325 mg Oral Daily  . atorvastatin  40 mg Oral q1800  . heparin  5,000 Units Subcutaneous 3 times per day  . 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  Symptoms now resolved.  MRI of the brain personally reviewed and shows no acute changes.  T2 changes likely secondary to microvascular disease based on patient's presentation and uncontrolled co-morbidities/smoking.  Patient is hypertensive.  LDL is elevated at 180.  ESR 3.  UDS  negative.  A1c pending.   Carotid dopplers show no hemodynamically significant stenosis.  EEG normal.  Echocardiogram results are pending.    Recommendations: 1. Continue ASA daily 2. Agree with initiation of statin 3. BP control 4. Smoking cessation    LOS: 1 day   Alexis Goodell, MD [facility] [phone]  04/08/2014  2:29 PM

## 2014-04-08 NOTE — Procedures (Signed)
ELECTROENCEPHALOGRAM REPORT   Patient: Charles Harding       Room #: 4N-14 EEG No. ID:  Age: 49 y.o.        Sex: male Referring Physician: Dr Sharl MaLama Report Date:  04/08/2014        Interpreting Physician: Elspeth ChoSUMNER, Marji Kuehnel, Jill AlexandersJUSTIN  History: Charles Sellerodd Wren is an 49 y.o. male presenting with transient episode of weakness and paresthesias on the left side  Medications:  Scheduled: . aspirin  300 mg Rectal Daily   Or  . aspirin  325 mg Oral Daily  . atorvastatin  40 mg Oral q1800  . heparin  5,000 Units Subcutaneous 3 times per day  . nicotine  21 mg Transdermal Daily    Conditions of Recording:  This is a 16 channel EEG carried out with the patient in the awake state.  Description:  The waking background activity consists of a low voltage, symmetrical, fairly well organized, 10-11 Hz alpha activity, seen from the parieto-occipital and posterior temporal regions.  Low voltage fast activity, poorly organized, is seen anteriorly and is at times superimposed on more posterior regions.  No focal slowing or epileptiform activity is noted.   The patient drowses with slowing to irregular, low voltage theta and beta activity. The patient goes in to a light sleep with symmetrical sleep spindles, and irregular slow activity. Hyperventilation was not performed. Intermittent photic stimulation was performed but failed to illicit any change in the tracing.    IMPRESSION: Normal electroencephalogram, awake, asleep and with activation procedures. There are no focal lateralizing or epileptiform features.    Elspeth Choeter Nicholl Onstott, DO [facility] [phone]  If 7pm- 7am, please page neurology on call as listed in AMION. 04/08/2014, 11:36 AM

## 2014-04-08 NOTE — Progress Notes (Signed)
New medication education done with the patient and his family.  Information sheet given to the patient.  Will continue to monitor. Sondra ComeSilva, Disa Riedlinger M, RN

## 2014-04-08 NOTE — Progress Notes (Signed)
EEG Completed; Results Pending  

## 2014-04-08 NOTE — Progress Notes (Signed)
[facility] PROGRESS NOTE  Charles Harding WUJ:[id number] DOB: 05-13-1965 DOA: 04/07/2014 PCP: No primary care provider on file.  Assessment/Plan: 1. TIA- MRI brain shows no acute change, continue with aspirin daily.Neurology following 2. Tobacco abuse- continue with nicotine patch.  Code Status: Full code Family Communication: No family at bedside Disposition Plan: Home when stable   Consultants:  Neurology   Procedures:  None  Antibiotics:  None  HPI/Subjective: Patient seen and examined, admitted with possible TIA. All the symptoms have now resolved.  Objective: Filed Vitals:   04/08/14 1448  BP: 142/97  Pulse: 81  Temp: 98.1 F (36.7 C)  Resp: 18    Intake/Output Summary (Last 24 hours) at 04/08/14 1658 Last data filed at 04/08/14 0834  Gross per 24 hour  Intake   1340 ml  Output    950 ml  Net    390 ml   Filed Weights   04/07/14 1809  Weight: 66.679 kg (147 lb)    Exam:   General:  Appear in no acute distress  Cardiovascular: S1S2 RRR  Respiratory: Clear bilaterally  Abdomen: Soft, nontender  Musculoskeletal: No edema  Data Reviewed: Basic Metabolic Panel:  Recent Labs Lab 04/07/14 1736 04/07/14 1742  NA 137 140  K 3.9 3.8  CL 105 103  CO2 24  --   GLUCOSE 107* 102*  BUN 10 11  CREATININE 1.09 1.10  CALCIUM 8.9  --    Liver Function Tests:  Recent Labs Lab 04/07/14 1736  AST 24  ALT 20  ALKPHOS 63  BILITOT 0.5  PROT 6.7  ALBUMIN 3.8   No results for input(s): LIPASE, AMYLASE in the last 168 hours. No results for input(s): AMMONIA in the last 168 hours. CBC:  Recent Labs Lab 04/07/14 1736 04/07/14 1742  WBC 6.6  --   NEUTROABS 3.3  --   HGB 13.0 15.3  HCT 39.8 45.0  MCV 91.7  --   PLT 202  --    Cardiac Enzymes: No results for input(s): CKTOTAL, CKMB, CKMBINDEX, TROPONINI in the last 168 hours. BNP (last 3 results) No results for input(s): BNP in the last 8760 hours.  ProBNP (last 3 results) No  results for input(s): PROBNP in the last 8760 hours.  CBG:  Recent Labs Lab 04/07/14 1826  GLUCAP 86    No results found for this or any previous visit (from the past 240 hour(s)).   Studies: Ct Head (brain) Wo Contrast  04/07/2014   CLINICAL DATA:  Code stroke.  TIA symptoms now resolving.  EXAM: CT HEAD WITHOUT CONTRAST  TECHNIQUE: Contiguous axial images were obtained from the base of the skull through the vertex without intravenous contrast.  COMPARISON:  None.  FINDINGS: Ventricles and sulci appear symmetrical. No mass effect or midline shift. No abnormal extra-axial fluid collections. Gray-white matter junctions are distinct. Basal cisterns are not effaced. No evidence of acute intracranial hemorrhage. No depressed skull fractures. Visualized paranasal sinuses and mastoid air cells are not opacified.  IMPRESSION: No acute intracranial abnormalities.  Normal examination.  These results were called by telephone at the time of interpretation on 04/07/2014 at 5:54 pm to Dr. Jerelyn Scott , who verbally acknowledged these results.   Electronically Signed   By: Burman Nieves M.D.   On: 04/07/2014 17:55   Mr Brain Wo Contrast  04/07/2014   CLINICAL DATA:  Acute onset of tingling sensation in the left arm and leg with gait abnormality. Tingling sensation in the face and drooling from left  side of the mouth. The symptoms have since resolved.  EXAM: MRI HEAD WITHOUT CONTRAST  MRA HEAD WITHOUT CONTRAST  TECHNIQUE: Multiplanar, multiecho pulse sequences of the brain and surrounding structures were obtained without intravenous contrast. Angiographic images of the head were obtained using MRA technique without contrast.  COMPARISON:  CT head from the same day.  FINDINGS: MRI HEAD FINDINGS  The diffusion-weighted images demonstrate no evidence for acute or subacute infarction.  Multiple subcortical T2 hyperintensities are present bilaterally. T2 changes are evident along the colossal septal margin  bilaterally.  No acute hemorrhage or mass lesion is present. No significant white matter lesions are present within the posterior fossa.  Flow is present in the major intracranial arteries. The globes and orbits are intact. Mild mucosal thickening is present in the anterior ethmoid air cells on the left. There is some fluid in the mastoid air cells, right greater than left. No obstructing nasopharyngeal lesion is evident.  MRA HEAD FINDINGS  The internal carotid arteries are within normal limits from the high cervical segments through the ICA termini bilaterally. The A1 and M1 segments are normal. The anterior communicating artery is patent. The A1 and M1 segments are normal. The MCA bifurcations are within normal limits. ACA and MCA branch vessels are normal.  The vertebral arteries are codominant. The left PICA origin is visualized and normal. The right AICA is dominant. The basilar artery is normal. Both posterior cerebral arteries originate from the basilar tip. A prominent right posterior communicating artery is present as well. The PCA branch vessels are within normal limits.  IMPRESSION: 1. Multiple periventricular and subcortical T2 hyperintensities bilaterally. These are greater than expected for age. The finding is nonspecific but can be seen in the setting of chronic microvascular ischemia, a demyelinating process such as multiple sclerosis, vasculitis, complicated migraine headaches, or as the sequelae of a prior infectious or inflammatory process. 2. No acute intracranial abnormality. Specifically no evidence for acute infarct. 3. Normal MRA circle of Willis without evidence for significant proximal stenosis, aneurysm, or branch vessel occlusion.   Electronically Signed   By: Marin Roberts M.D.   On: 04/07/2014 20:22   Mr Maxine Glenn Head/brain Wo Cm  04/07/2014   CLINICAL DATA:  Acute onset of tingling sensation in the left arm and leg with gait abnormality. Tingling sensation in the face and  drooling from left side of the mouth. The symptoms have since resolved.  EXAM: MRI HEAD WITHOUT CONTRAST  MRA HEAD WITHOUT CONTRAST  TECHNIQUE: Multiplanar, multiecho pulse sequences of the brain and surrounding structures were obtained without intravenous contrast. Angiographic images of the head were obtained using MRA technique without contrast.  COMPARISON:  CT head from the same day.  FINDINGS: MRI HEAD FINDINGS  The diffusion-weighted images demonstrate no evidence for acute or subacute infarction.  Multiple subcortical T2 hyperintensities are present bilaterally. T2 changes are evident along the colossal septal margin bilaterally.  No acute hemorrhage or mass lesion is present. No significant white matter lesions are present within the posterior fossa.  Flow is present in the major intracranial arteries. The globes and orbits are intact. Mild mucosal thickening is present in the anterior ethmoid air cells on the left. There is some fluid in the mastoid air cells, right greater than left. No obstructing nasopharyngeal lesion is evident.  MRA HEAD FINDINGS  The internal carotid arteries are within normal limits from the high cervical segments through the ICA termini bilaterally. The A1 and M1 segments are normal. The anterior communicating  artery is patent. The A1 and M1 segments are normal. The MCA bifurcations are within normal limits. ACA and MCA branch vessels are normal.  The vertebral arteries are codominant. The left PICA origin is visualized and normal. The right AICA is dominant. The basilar artery is normal. Both posterior cerebral arteries originate from the basilar tip. A prominent right posterior communicating artery is present as well. The PCA branch vessels are within normal limits.  IMPRESSION: 1. Multiple periventricular and subcortical T2 hyperintensities bilaterally. These are greater than expected for age. The finding is nonspecific but can be seen in the setting of chronic microvascular  ischemia, a demyelinating process such as multiple sclerosis, vasculitis, complicated migraine headaches, or as the sequelae of a prior infectious or inflammatory process. 2. No acute intracranial abnormality. Specifically no evidence for acute infarct. 3. Normal MRA circle of Willis without evidence for significant proximal stenosis, aneurysm, or branch vessel occlusion.   Electronically Signed   By: Marin Robertshristopher  Mattern M.D.   On: 04/07/2014 20:22    Scheduled Meds: . aspirin  300 mg Rectal Daily   Or  . aspirin  325 mg Oral Daily  . atorvastatin  40 mg Oral q1800  . heparin  5,000 Units Subcutaneous 3 times per day  . nicotine  21 mg Transdermal Daily   Continuous Infusions: . sodium chloride 75 mL/hr at 04/08/14 0455    Principal Problem:   TIA (transient ischemic attack) Active Problems:   Tobacco abuse   Elevated blood pressure    Time spent: 25 min    [facility] CenterAMA,Lilliahna Schubring S  [facility] Pager [phone] If 7PM-7AM, please contact night-coverage at [email or web address], password [email or web address] 04/08/2014, 4:58 PM  LOS: 1 day

## 2014-04-08 NOTE — Progress Notes (Signed)
Received pt via [facility]; pt was stable upon arrival; vital signs checked; nih scale performed; pt oriented to the unit; pt was hungry and was fed a [place] sandwich; pt has no complaints at this time.  I will continue to perform q2 hr neuro and v/s checks

## 2014-04-08 NOTE — Progress Notes (Signed)
PT Cancellation Note  Patient Details Name: Charles Harding MRN: [id number] DOB: 02/12/1966   Cancelled Treatment:    Reason Eval/Treat Not Completed: Medical issues which prohibited therapy. Patient's BP remains high at rest (recently 173/101).   Jhada Risk 04/08/2014, 1:41 PM  Pager [phone]

## 2014-04-08 NOTE — Progress Notes (Signed)
PT Discharge Note  Patient Details Name: Woody Sellerodd Hoel MRN: [id number] DOB: 12/28/1965   Cancelled Treatment:    Reason Eval/Treat Not Completed: PT screened, no needs identified, will sign off   Spoke with patient and he has been up with nursing and feels he is at his baseline. Pt requested to defer PT eval.   Daryle Amis 04/08/2014, 3:41 PM  Pager [phone]

## 2014-04-08 NOTE — Progress Notes (Signed)
SLP Cancellation Note  Patient Details Name: Charles Harding MRN: [id number] DOB: 01/08/1966   Cancelled treatment:       Reason Eval/Treat Not Completed: SLP screened, no needs identified, will sign off   Blenda MountsCouture, Javonn Gauger Laurice 04/08/2014, 2:57 PM

## 2014-04-08 NOTE — Progress Notes (Signed)
PT Cancellation Note  Patient Details Name: Charles Sellerodd [place] MRN: [id number] DOB: 08/03/1965   Cancelled Treatment:    Reason Eval/Treat Not Completed: Pt remains on bedrest and noted BP's overnight continued to be elevated (often above 180/105). Will await incr activity orders and approriate management of BP prior to PT eval.   Theone Bowell 04/08/2014, 8:08 AM  Pager [phone]

## 2014-04-09 ENCOUNTER — Inpatient Hospital Stay (HOSPITAL_COMMUNITY): Payer: BLUE CROSS/BLUE SHIELD

## 2014-04-09 ENCOUNTER — Encounter (HOSPITAL_COMMUNITY): Payer: Self-pay | Admitting: Radiology

## 2014-04-09 DIAGNOSIS — I639 Cerebral infarction, unspecified: Secondary | ICD-10-CM

## 2014-04-09 DIAGNOSIS — E785 Hyperlipidemia, unspecified: Secondary | ICD-10-CM | POA: Diagnosis present

## 2014-04-09 LAB — MRSA PCR SCREENING: MRSA BY PCR: NEGATIVE

## 2014-04-09 LAB — HEMOGLOBIN A1C
Hgb A1c MFr Bld: 4.8 % (ref 4.8–5.6)
MEAN PLASMA GLUCOSE: 91 mg/dL

## 2014-04-09 MED ORDER — ACETAMINOPHEN 650 MG RE SUPP: 650.0000 mg | RECTAL | Status: DC | PRN

## 2014-04-09 MED ORDER — HYDRALAZINE HCL 20 MG/ML IJ SOLN
5.0000 mg | Freq: Once | INTRAMUSCULAR | Status: AC
  Administered 2014-04-09: 5 mg via INTRAVENOUS

## 2014-04-09 MED ORDER — HYDRALAZINE HCL 20 MG/ML IJ SOLN
INTRAMUSCULAR | Status: AC
  Administered 2014-04-09: 5 mg
  Filled 2014-04-09: qty 1

## 2014-04-09 MED ORDER — HYDRALAZINE HCL 20 MG/ML IJ SOLN: 5.0000 mg | Freq: Once | INTRAMUSCULAR | Status: DC

## 2014-04-09 MED ORDER — SODIUM CHLORIDE 0.9 % IV SOLN: 50.0000 mL | Freq: Once | INTRAVENOUS | Status: DC

## 2014-04-09 MED ORDER — SODIUM CHLORIDE 0.9 % IV SOLN
INTRAVENOUS | Status: DC
Start: 2014-04-09 — End: 2014-04-09
  Administered 2014-04-09: 04:00:00 via INTRAVENOUS

## 2014-04-09 MED ORDER — ALTEPLASE (STROKE) FULL DOSE INFUSION
0.9000 mg/kg | Freq: Once | INTRAVENOUS | Status: AC
  Administered 2014-04-09: 60 mg via INTRAVENOUS
  Filled 2014-04-09: qty 60

## 2014-04-09 MED ORDER — SODIUM CHLORIDE 0.9 % IV SOLN
INTRAVENOUS | Status: DC
  Administered 2014-04-09 (×2): via INTRAVENOUS

## 2014-04-09 MED ORDER — LABETALOL HCL 5 MG/ML IV SOLN: 10.0000 mg | INTRAVENOUS | Status: DC | PRN

## 2014-04-09 MED ORDER — PANTOPRAZOLE SODIUM 40 MG IV SOLR: 40.0000 mg | Freq: Every day | INTRAVENOUS | Status: DC

## 2014-04-09 MED ORDER — ACETAMINOPHEN 325 MG PO TABS: 650.0000 mg | ORAL_TABLET | ORAL | Status: DC | PRN

## 2014-04-09 MED ORDER — STROKE: EARLY STAGES OF RECOVERY BOOK
Freq: Once | Status: AC
  Administered 2014-04-09: 06:00:00
  Filled 2014-04-09: qty 1

## 2014-04-09 MED ORDER — SENNOSIDES-DOCUSATE SODIUM 8.6-50 MG PO TABS
1.0000 | ORAL_TABLET | Freq: Every evening | ORAL | Status: DC | PRN
  Filled 2014-04-09: qty 1

## 2014-04-09 NOTE — Progress Notes (Addendum)
2:45 pt called out stating that he could not move his left arm; when I entered the room immediately his left arm and left leg were flacid; left sided facial droop; fingers were contracted on left hand.  I called rapid response and Si GaulBrooke White arrived at 2:52 and called a code stroke; we transported patient to CT and placed him on the table as his symptoms were starting to resolve.  After CT patient was brought back to his room and evaluated by the DR.  His symptoms would appear, then resolve, and then reappear again.  V/S were stable and pt was taken to MRI for evaluation before TPA could be used.  Patient was transferred to [facility] room 14 (ICU).  NT, Melaine packed up his room and transported his belonging to his new room.  I asked pt several times if he wanted me to contact his family, and pt said no.  While giving report to his new nurse, I asked again and patient said it was ok to call.  Number in computer is invalid, unable to  Notify family.  Patient was notified that family unable to be contacted.  Patient provided new contact info, which i entered in the computer.  Wife was notified of condition change and new room location at 6:01am

## 2014-04-09 NOTE — Progress Notes (Signed)
Patient arrived from [facility] via wheelchair. He is alert, denied pain at this time. TELE applied and confirmed. Safety precautions and orders reviewed with patient. EMMI paper given and explained. Family notify of arrival. Will continue to monitor.  Sim BoastHavy, RN

## 2014-04-09 NOTE — Progress Notes (Addendum)
Code stroke was activated as patient informed his nurse around 2 am that his left arm was weak. Then he was noted to have left face droop and was not taking. Patient was immediately sent for CT brain which revealed no acute abnormality. While in the CT suite patient' symptoms resolved but came back again at the time I saw him in his room, with NIHSS 10.  However, few minutes later he was back to baseline again. I decided to obtain DWI MRI and after completion of MRI he became symptomatic again and thus I decided to administer IV tpa. MRI brain showed faint reduced diffusion RIGHT caudate body concerning for acute ischemia, less likely ex hyperacute demyelination. Patient was transferred to the NICU. Last seen normal at 2 am 04/09/13. Stroke team will resume care in the morning.  Charles Portelasvaldo Camilo, MD [facility]

## 2014-04-09 NOTE — Evaluation (Signed)
Clinical/Bedside Swallow Evaluation Patient Details  Name: Woody Sellerodd Medel MRN: [id number] Date of Birth: 03/29/1965  Today's Date: 04/09/2014 Time: SLP Start Time (ACUTE ONLY): 0827 SLP Stop Time (ACUTE ONLY): 0840 SLP Time Calculation (min) (ACUTE ONLY): 13 min  Past Medical History: History reviewed. No pertinent past medical history. Past Surgical History: History reviewed. No pertinent past surgical history. HPI:  49 y.o. male with PMH of smoking, DM, CVA who presents with left side tingling. Per MD's documentation pt's symptoms completely resolved. MRI Faint reduced diffusion RIGHT caudate body concerning for acute ischemia. He developed new stroke symptoms, code stroke called, repeat MRI and TPA administered.   Assessment / Plan / Recommendation Clinical Impression  Pt demonstrated safe and efficient swallow across various consistencies; no indications of pharyngeal impairments. Recommend regular diet texture and thin liquids, straws allowed, pills whole in applesauce, no ST needed.    Aspiration Risk  Mild    Diet Recommendation Regular;Thin liquid   Liquid Administration via: Cup;Straw Medication Administration: Whole meds with liquid Supervision: Patient able to self feed;Intermittent supervision to cue for compensatory strategies Compensations: Slow rate;Small sips/bites Postural Changes and/or Swallow Maneuvers: Seated upright 90 degrees    Other  Recommendations Oral Care Recommendations: Oral care BID   Follow Up Recommendations  None    Frequency and Duration        Pertinent Vitals/Pain Pt denied         Swallow Study         Oral/Motor/Sensory Function Overall Oral Motor/Sensory Function: Impaired Labial ROM: Reduced left Labial Symmetry: Abnormal symmetry left Labial Strength: Reduced Labial Sensation: Within Functional Limits Lingual ROM: Within Functional Limits Lingual Symmetry: Abnormal symmetry left Lingual Strength: Within Functional  Limits Lingual Sensation: Within Functional Limits Facial ROM: Within Functional Limits Mandible: Within Functional Limits   Ice Chips Ice chips: Not tested   Thin Liquid Thin Liquid: Within functional limits Presentation: Cup;Straw    Nectar Thick Nectar Thick Liquid: Not tested   Honey Thick Honey Thick Liquid: Not tested   Puree Puree: Not tested   Solid   GO    Solid: Within functional limits       Roque CashLitaker, Breck CoonsLisa Willis 04/09/2014,8:59 AM  Breck CoonsLisa Willis Lonell FaceLitaker M.Ed [employer] Pager [phone]

## 2014-04-09 NOTE — Progress Notes (Signed)
Called per 4N floor RN at 0250 regarding pt with new stroke symptoms. LSN at 0200 per floor RN. Per RN at [phone] pt used call bell and notified RN that his left arm was numb and he was unable to move it. Per RN his voice grew more slurred until he was unable to to speak at all and he developed a major left facial droop. Code Stroke called at 0305 and pt taken to CT scan Stat. CT scan negative per Neurologist. NIHSS initially 16, with severe weakness in left leg, flaccid left arm facial droop, ataxia, and mute speech. Upon arrival to CT scan pt symptoms began resolving and upon arrival back to room at 0320 his symptoms had resolved completely. At 0325 his symptoms returned and waxed and waned several more times this morning. See stroke flow sheet for specifics. EKG done reveling NSR. Pt taken to MRI with no symptoms, yet upon completion of exam at 0510 he developed a facial droop and left side weakness again. Dr.Cammilo paged and MRI results reviewed per Neurologist. TPA ordered. Pt transferred to 4823m, 2nd IV site started and FC placed. TPA started at 0540 after giving hydralazine to reduce HTN. Pt left on 3 M, RRT report provided to ICU RN.

## 2014-04-09 NOTE — Progress Notes (Signed)
Chaplain initiated visit with pt and family. Pt moved from 4N to ICU earlier today. Chaplain offered prayer. Page chaplain as needed.    04/09/14 1100  Clinical Encounter Type  Visited With Patient and family together  Visit Type Initial;Spiritual support  Spiritual Encounters  Spiritual Needs Emotional;Prayer  Stress Factors  Patient Stress Factors Health changes  Delainee Tramel, Mayer MaskerCourtney F, Chaplain 04/09/2014 11:02 AM

## 2014-04-09 NOTE — Progress Notes (Signed)
eLink Physician-Brief Progress Note Patient Name: Charles Harding DOB: 06/13/1965 MRN: [id number]   Date of Service  04/09/2014  HPI/Events of Note  [facility]. Transferred to NICU 2/26 AM as code stroke and received systemic tPA. Very comfortable presently. No acute needs  eICU Interventions  Monitor     Intervention Category Evaluation Type: New Patient Evaluation  Billy FischerDavid Simonds 04/09/2014, 5:50 AM

## 2014-04-09 NOTE — Progress Notes (Signed)
STROKE TEAM PROGRESS NOTE   HISTORY Charles Harding is an 49 y.o. male who reports that he was at work today. Acutely noted tingling in his left arm and leg. His gait was difficult. He decided he should go home early and went to his car. Noted that he had tingling on the left side of his face as well and was drooling from the left side of his mouth. (LKW 04/07/2014 at 1630). He as unable to operate his car. He asked someone to call EMS who brought him in as a code stoke. Patient began to improve en route. Initial NIHSS of . tPA was not Given due to Improvement in symptoms. He was admitted to 4N.  Inpatient Code Stroke was activated 04/09/2013 as patient informed his nurse around 2 am that his left arm was weak. Then he was noted to have left face droop and was not taking. Patient was immediately sent for CT brain which revealed no acute abnormality. While in the CT suite patient' symptoms resolved but came back again when seen by Dr. Cyril Mourning in his room, with NIHSS 10. However, few minutes later he was back to baseline again. It was decided to obtain a DWI MRI and after completion of MRI he became symptomatic again and thus I decided to administer IV tpa. MRI brain showed faint reduced diffusion RIGHT caudate body concerning for acute ischemia, less likely ex hyperacute demyelination. He was last seen normal at 2 am 04/09/13. Patient was transferred to the NICU and was administered IV tPA 04/09/2014 at 0539a. There was no large vessel occlusion in on the MRA. Patient was transferred to the Stroke team to assume care.   SUBJECTIVE (INTERVAL HISTORY) His family is at the bedside.  Overall he feels his condition is rapidly improving. He is back to baseline.   OBJECTIVE Temp:  [97.5 F (36.4 C)-98.8 F (37.1 C)] 98.8 F (37.1 C) (02/26 0745) Pulse Rate:  [69-88] 85 (02/26 0830) Cardiac Rhythm:  [-] Normal sinus rhythm (02/26 0800) Resp:  [10-20] 17 (02/26 0830) BP: (142-185)/(77-117) 176/102 mmHg  (02/26 0830) SpO2:  [94 %-100 %] 99 % (02/26 0830)   Recent Labs Lab 04/07/14 1826  GLUCAP 86    Recent Labs Lab 04/07/14 1736 04/07/14 1742  NA 137 140  K 3.9 3.8  CL 105 103  CO2 24  --   GLUCOSE 107* 102*  BUN 10 11  CREATININE 1.09 1.10  CALCIUM 8.9  --     Recent Labs Lab 04/07/14 1736  AST 24  ALT 20  ALKPHOS 63  BILITOT 0.5  PROT 6.7  ALBUMIN 3.8    Recent Labs Lab 04/07/14 1736 04/07/14 1742  WBC 6.6  --   NEUTROABS 3.3  --   HGB 13.0 15.3  HCT 39.8 45.0  MCV 91.7  --   PLT 202  --    No results for input(s): CKTOTAL, CKMB, CKMBINDEX, TROPONINI in the last 168 hours.  Recent Labs  04/07/14 1736  LABPROT 12.6  INR 0.94   No results for input(s): COLORURINE, LABSPEC, PHURINE, GLUCOSEU, HGBUR, BILIRUBINUR, KETONESUR, PROTEINUR, UROBILINOGEN, NITRITE, LEUKOCYTESUR in the last 72 hours.  Invalid input(s): APPERANCEUR     Component Value Date/Time   CHOL 240* 04/08/2014 0404   TRIG 86 04/08/2014 0404   HDL 43 04/08/2014 0404   CHOLHDL 5.6 04/08/2014 0404   VLDL 17 04/08/2014 0404   LDLCALC 180* 04/08/2014 0404   Lab Results  Component Value Date   HGBA1C 4.8 04/08/2014  Component Value Date/Time   LABOPIA NONE DETECTED 04/07/2014 1914   COCAINSCRNUR NONE DETECTED 04/07/2014 1914   LABBENZ NONE DETECTED 04/07/2014 1914   AMPHETMU NONE DETECTED 04/07/2014 1914   THCU NONE DETECTED 04/07/2014 1914   LABBARB NONE DETECTED 04/07/2014 1914    No results for input(s): ETH in the last 168 hours.  Ct Head Wo Contrast  04/09/2014   CLINICAL DATA:  LEFT-sided facial drooping, garbled speech, LEFT-sided flaccidity beginning at 0245 hours tonight. History of hypertension and tobacco use.  EXAM: CT HEAD WITHOUT CONTRAST  TECHNIQUE: Contiguous axial images were obtained from the base of the skull through the vertex without intravenous contrast.  COMPARISON:  MRI of the brain April 07, 2014 at 1944 hours  FINDINGS: The ventricles and sulci  are normal. No intraparenchymal hemorrhage, mass effect nor midline shift. No acute large vascular territory infarcts. Patchy supratentorial white matter hypodensities, advanced for age though, stable from prior imaging.  No abnormal extra-axial fluid collections. Basal cisterns are patent. Mild calcific atherosclerosis of the carotid siphons.  No skull fracture. The included ocular globes and orbital contents are non-suspicious. The mastoid aircells and included paranasal sinuses are well-aerated.  IMPRESSION: No acute intracranial process.  Mild to moderate white matter changes, advanced for age, better characterized on MRI of the brain from yesterday.   Electronically Signed   By: Awilda Metro   On: 04/09/2014 03:51   Ct Head (brain) Wo Contrast  04/07/2014   CLINICAL DATA:  Code stroke.  TIA symptoms now resolving.  EXAM: CT HEAD WITHOUT CONTRAST  TECHNIQUE: Contiguous axial images were obtained from the base of the skull through the vertex without intravenous contrast.  COMPARISON:  None.  FINDINGS: Ventricles and sulci appear symmetrical. No mass effect or midline shift. No abnormal extra-axial fluid collections. Gray-white matter junctions are distinct. Basal cisterns are not effaced. No evidence of acute intracranial hemorrhage. No depressed skull fractures. Visualized paranasal sinuses and mastoid air cells are not opacified.  IMPRESSION: No acute intracranial abnormalities.  Normal examination.  These results were called by telephone at the time of interpretation on 04/07/2014 at 5:54 pm to Dr. Jerelyn Scott , who verbally acknowledged these results.   Electronically Signed   By: Burman Nieves M.D.   On: 04/07/2014 17:55   Mr Brain Wo Contrast  04/09/2014   CLINICAL DATA:  Recurrent LEFT-sided weakness and facial droop at 0200 hours, symptoms resolved after 10 minutes, symptoms recurred and subsequently resolved again.  EXAM: MRI HEAD WITHOUT CONTRAST  TECHNIQUE: Multiplanar, multiecho pulse  sequences of the brain and surrounding structures were obtained without intravenous contrast.  COMPARISON:  CT of the head April 09, 2014 at 3:23 a.m. and MRI of the brain April 07, 2014  FINDINGS: Mild motion degraded examination.  The ventricles and sulci are normal for patient's age. No abnormal parenchymal signal, mass lesions, mass effect. Faint reduced diffusion within RIGHT caudate body. Faint suspected low ADC values, faint T2 hyperintense signal. No susceptibility artifact to suggest hemorrhage. Similar patchy supratentorial white matter T2 hyperintensities, advanced for age.  No abnormal extra-axial fluid collections. No extra-axial masses though, contrast enhanced sequences would be more sensitive. Normal major intracranial vascular flow voids seen at the skull base.  Ocular globes and orbital contents are unremarkable though not tailored for evaluation. No abnormal sellar expansion. Visualized paranasal sinuses and mastoid air cells are well-aerated. No suspicious calvarial bone marrow signal. No abnormal sellar expansion. Craniocervical junction maintained.  IMPRESSION: Faint reduced diffusion RIGHT caudate body  concerning for acute ischemia, less likely ex hyperacute demyelination.  Advanced white matter changes for age, nonspecific and may reflect sequelae of chronic hypertension, migraine ; atypical distribution for demyelination.   Electronically Signed   By: Awilda Metroourtnay  Bloomer   On: 04/09/2014 05:19   Mr Brain Wo Contrast  04/07/2014   CLINICAL DATA:  Acute onset of tingling sensation in the left arm and leg with gait abnormality. Tingling sensation in the face and drooling from left side of the mouth. The symptoms have since resolved.  EXAM: MRI HEAD WITHOUT CONTRAST  MRA HEAD WITHOUT CONTRAST  TECHNIQUE: Multiplanar, multiecho pulse sequences of the brain and surrounding structures were obtained without intravenous contrast. Angiographic images of the head were obtained using MRA  technique without contrast.  COMPARISON:  CT head from the same day.  FINDINGS: MRI HEAD FINDINGS  The diffusion-weighted images demonstrate no evidence for acute or subacute infarction.  Multiple subcortical T2 hyperintensities are present bilaterally. T2 changes are evident along the colossal septal margin bilaterally.  No acute hemorrhage or mass lesion is present. No significant white matter lesions are present within the posterior fossa.  Flow is present in the major intracranial arteries. The globes and orbits are intact. Mild mucosal thickening is present in the anterior ethmoid air cells on the left. There is some fluid in the mastoid air cells, right greater than left. No obstructing nasopharyngeal lesion is evident.  MRA HEAD FINDINGS  The internal carotid arteries are within normal limits from the high cervical segments through the ICA termini bilaterally. The A1 and M1 segments are normal. The anterior communicating artery is patent. The A1 and M1 segments are normal. The MCA bifurcations are within normal limits. ACA and MCA branch vessels are normal.  The vertebral arteries are codominant. The left PICA origin is visualized and normal. The right AICA is dominant. The basilar artery is normal. Both posterior cerebral arteries originate from the basilar tip. A prominent right posterior communicating artery is present as well. The PCA branch vessels are within normal limits.  IMPRESSION: 1. Multiple periventricular and subcortical T2 hyperintensities bilaterally. These are greater than expected for age. The finding is nonspecific but can be seen in the setting of chronic microvascular ischemia, a demyelinating process such as multiple sclerosis, vasculitis, complicated migraine headaches, or as the sequelae of a prior infectious or inflammatory process. 2. No acute intracranial abnormality. Specifically no evidence for acute infarct. 3. Normal MRA circle of Willis without evidence for significant proximal  stenosis, aneurysm, or branch vessel occlusion.   Electronically Signed   By: Marin Robertshristopher  Mattern M.D.   On: 04/07/2014 20:22   Mr Maxine GlennMra Head/brain Wo Cm  04/07/2014   CLINICAL DATA:  Acute onset of tingling sensation in the left arm and leg with gait abnormality. Tingling sensation in the face and drooling from left side of the mouth. The symptoms have since resolved.  EXAM: MRI HEAD WITHOUT CONTRAST  MRA HEAD WITHOUT CONTRAST  TECHNIQUE: Multiplanar, multiecho pulse sequences of the brain and surrounding structures were obtained without intravenous contrast. Angiographic images of the head were obtained using MRA technique without contrast.  COMPARISON:  CT head from the same day.  FINDINGS: MRI HEAD FINDINGS  The diffusion-weighted images demonstrate no evidence for acute or subacute infarction.  Multiple subcortical T2 hyperintensities are present bilaterally. T2 changes are evident along the colossal septal margin bilaterally.  No acute hemorrhage or mass lesion is present. No significant white matter lesions are present within the posterior fossa.  Flow is present in the major intracranial arteries. The globes and orbits are intact. Mild mucosal thickening is present in the anterior ethmoid air cells on the left. There is some fluid in the mastoid air cells, right greater than left. No obstructing nasopharyngeal lesion is evident.  MRA HEAD FINDINGS  The internal carotid arteries are within normal limits from the high cervical segments through the ICA termini bilaterally. The A1 and M1 segments are normal. The anterior communicating artery is patent. The A1 and M1 segments are normal. The MCA bifurcations are within normal limits. ACA and MCA branch vessels are normal.  The vertebral arteries are codominant. The left PICA origin is visualized and normal. The right AICA is dominant. The basilar artery is normal. Both posterior cerebral arteries originate from the basilar tip. A prominent right posterior  communicating artery is present as well. The PCA branch vessels are within normal limits.  IMPRESSION: 1. Multiple periventricular and subcortical T2 hyperintensities bilaterally. These are greater than expected for age. The finding is nonspecific but can be seen in the setting of chronic microvascular ischemia, a demyelinating process such as multiple sclerosis, vasculitis, complicated migraine headaches, or as the sequelae of a prior infectious or inflammatory process. 2. No acute intracranial abnormality. Specifically no evidence for acute infarct. 3. Normal MRA circle of Willis without evidence for significant proximal stenosis, aneurysm, or branch vessel occlusion.   Electronically Signed   By: Marin Roberts M.D.   On: 04/07/2014 20:22   Carotid Doppler  There is 1-39% bilateral ICA stenosis. Vertebral artery flow is antegrade.    2D Echocardiogram  EF 55-60% with no source of embolus.    PHYSICAL EXAM Obese young Caucasian male not in distress. . Afebrile. Head is nontraumatic. Neck is supple without bruit.    Cardiac exam no murmur or gallop. Lungs are clear to auscultation. Distal pulses are well felt. Neurological Exam :    Awake  Alert oriented x 3. Normal speech and language.eye movements full without nystagmus.fundi were not visualized. Vision acuity and fields appear normal. Hearing is normal. Palatal movements are normal. Face symmetric but mild nasolabial asymmetry when smiling.. Tongue midline. Normal strength, tone, reflexes and coordination. Normal sensation. Gait deferred.  ASSESSMENT/PLAN Mr. Charles Harding is a 49 y.o. male with history of cigarette smoking who presented with left sided tingling, then developed left sided numbness and hemiparesis in hospital. He received IV t-PA 04/09/2014 at 0539.   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 Repeat MRI w/ R caudate infarct  MRA  No large vessel occlusion  Carotid Doppler  No significant stenosis   2D Echo  No source of embolus   HgbA1c 4.8  SCDs for VTE prophylaxis  Diet NPO time specified. ST has reassessed and he has passed. To resume diet  no antithrombotic prior to admission, recevied aspirin in hospital 2/24 and 2/25, now on no antithrombotic as within 24h post tPA. Plan to resume aspirin 325 mg daily once cleared post tPA  Repeat CT 24h post tPA (ordered for am)  Ongoing aggressive stroke risk factor management  Transfer to 4N, for post tPA monitoring   Therapy recommendations:  Pending, none anticipated. Ok to be OOB  Disposition:  Anticipate return home  Hypertension  Home meds:   none  Stable  Received IV push for acute hypertension after placing foley  Long-term BP goal 130/90  Hyperlipidemia  Home meds:  No statin resumed in hospital  LDL 180, goal < 70  lipitor 40 mg added during hopsitalization  Continue statin at discharge  Other Stroke Risk Factors  Cigarette smoker, advised to stop smoking  Other Active Problems  Foley placed - need to remove at 24h if pt stable  Hospital day # 2  [facility] See Amion for Pager information 04/09/2014 2:05 PM  I have personally examined this patient, reviewed notes, independently viewed imaging studies, participated in medical decision making and plan of care. I have made any additions or clarifications directly to the above note. Agree with note above. Patient presented with fluctuating left-sided weakness secondary to right brain subcortical infarct from small vessel disease. He has been treated with TPA with significant neurological improvement. He however remains at risk for neurological worsening, brain hemorrhage and needs close neurological monitoring and tight blood pressure control. Also continue ongoing stroke evaluation and aggressive risk  factor modification.  This patient is critically ill and at significant risk of neurological worsening, death and care requires constant monitoring of vital signs, hemodynamics,respiratory and cardiac monitoring,review of multiple databases, neurological assessment, discussion with family, other specialists and medical decision making of high complexity.I have made any additions or clarifications directly to the above note.  I spent 30 minutes of neurocritical care time  in the care of  this patient. Delia Heady, MD Medical Director [facility] Pager: [phone] 04/09/2014 2:21 PM    To contact Stroke Continuity provider, please refer to [email or web address]. After hours, contact General Neurology

## 2014-04-09 NOTE — Progress Notes (Signed)
OT Cancellation Note  Patient Details Name: Woody Sellerodd Guandique MRN: [id number] DOB: 05/11/1965   Cancelled Treatment:    Reason Eval/Treat Not Completed: Medical issues which prohibited therapy. Increase in stroke symptoms, pt given tpa and moved to neuro ICU.  Evette GeorgesLeonard, Alexandera Kuntzman Eva [phone] 04/09/2014, 10:14 AM

## 2014-04-09 NOTE — Progress Notes (Signed)
Speech Language Pathology  Patient Details Name: Woody Sellerodd Alvis MRN: [id number] DOB: 02/16/1965 Today's Date: 04/09/2014 Time: [id number] SLP Time Calculation (min) (ACUTE ONLY): 13 min   Pt's speech-cognition screened to be functional 2/25. He developed worsening symptoms, received TPA; MD reordered ST. Pt seen by ST for BSE; speech-language-cognition again screened. Pt and family states his speech and cognition have again returned to baseline, no deficits.   Breck CoonsLisa Willis Bemus PointLitaker M.Ed [employer] Pager [phone]

## 2014-04-10 ENCOUNTER — Inpatient Hospital Stay (HOSPITAL_COMMUNITY): Payer: BLUE CROSS/BLUE SHIELD

## 2014-04-10 DIAGNOSIS — I6330 Cerebral infarction due to thrombosis of unspecified cerebral artery: Secondary | ICD-10-CM

## 2014-04-10 MED ORDER — ASPIRIN EC 325 MG PO TBEC
325.0000 mg | DELAYED_RELEASE_TABLET | Freq: Every day | ORAL | Status: DC
  Administered 2014-04-10 – 2014-04-11 (×2): 325 mg via ORAL
  Filled 2014-04-10 (×2): qty 1

## 2014-04-10 NOTE — Progress Notes (Signed)
PT Cancellation Note  Patient Details Name: Charles Harding MRN: [id number] DOB: 06/08/1965   Cancelled Treatment:    Reason Eval/Treat Not Completed: PT screened, no needs identified, will sign off.   Observed pt up in hallway with OT. Pt reports he feels his walking is near baseline (slightly weak all over from lack of activity). OT reports no imbalance. Discussed stair climbing and does not appear this will be an issue for pt and he agrees.    Jazzelle Zhang 04/10/2014, 3:34 PM Pager [phone]

## 2014-04-10 NOTE — Progress Notes (Signed)
STROKE TEAM PROGRESS NOTE   HISTORY Charles Sellerodd Kreger is a 49 y.o. male who reports that he was at work today. Acutely noted tingling in his left arm and leg. His gait was difficult. He decided he should go home early and went to his car. Noted that he had tingling on the left side of his face as well and was drooling from the left side of his mouth. (LKW 04/07/2014 at 1630). He as unable to operate his car. He asked someone to call EMS who brought him in as a code stoke. Patient began to improve en route. Initial NIHSS of . tPA was not Given due to Improvement in symptoms. He was admitted to 4N.  Inpatient Code Stroke was activated 04/09/2013 as patient informed his nurse around 2 am that his left arm was weak. Then he was noted to have left face droop and was not taking. Patient was immediately sent for CT brain which revealed no acute abnormality. While in the CT suite patient' symptoms resolved but came back again when seen by Dr. Cyril Mourningamillo in his room, with NIHSS 10. However, few minutes later he was back to baseline again. It was decided to obtain a DWI MRI and after completion of MRI he became symptomatic again and thus I decided to administer IV tpa. MRI brain showed faint reduced diffusion RIGHT caudate body concerning for acute ischemia, less likely ex hyperacute demyelination. He was last seen normal at 2 am 04/09/13. Patient was transferred to the NICU and was administered IV tPA 04/09/2014 at 0539a. There was no large vessel occlusion in on the MRA. Patient was transferred to the Stroke team to assume care.   SUBJECTIVE (INTERVAL HISTORY) No family members present. The patient feels he is back to baseline.   OBJECTIVE Temp:  [97.5 F (36.4 C)-99.1 F (37.3 C)] 97.9 F (36.6 C) (02/27 0700) Pulse Rate:  [69-97] 90 (02/27 0800) Cardiac Rhythm:  [-] Normal sinus rhythm (02/26 2000) Resp:  [12-20] 16 (02/27 0800) BP: (137-174)/(75-104) 154/102 mmHg (02/27 0800) SpO2:  [96 %-100 %] 100 %  (02/27 0800)   Recent Labs Lab 04/07/14 1826  GLUCAP 86    Recent Labs Lab 04/07/14 1736 04/07/14 1742  NA 137 140  K 3.9 3.8  CL 105 103  CO2 24  --   GLUCOSE 107* 102*  BUN 10 11  CREATININE 1.09 1.10  CALCIUM 8.9  --     Recent Labs Lab 04/07/14 1736  AST 24  ALT 20  ALKPHOS 63  BILITOT 0.5  PROT 6.7  ALBUMIN 3.8    Recent Labs Lab 04/07/14 1736 04/07/14 1742  WBC 6.6  --   NEUTROABS 3.3  --   HGB 13.0 15.3  HCT 39.8 45.0  MCV 91.7  --   PLT 202  --    No results for input(s): CKTOTAL, CKMB, CKMBINDEX, TROPONINI in the last 168 hours.  Recent Labs  04/07/14 1736  LABPROT 12.6  INR 0.94   No results for input(s): COLORURINE, LABSPEC, PHURINE, GLUCOSEU, HGBUR, BILIRUBINUR, KETONESUR, PROTEINUR, UROBILINOGEN, NITRITE, LEUKOCYTESUR in the last 72 hours.  Invalid input(s): APPERANCEUR     Component Value Date/Time   CHOL 240* 04/08/2014 0404   TRIG 86 04/08/2014 0404   HDL 43 04/08/2014 0404   CHOLHDL 5.6 04/08/2014 0404   VLDL 17 04/08/2014 0404   LDLCALC 180* 04/08/2014 0404   Lab Results  Component Value Date   HGBA1C 4.8 04/08/2014      Component Value Date/Time  LABOPIA NONE DETECTED 04/07/2014 1914   COCAINSCRNUR NONE DETECTED 04/07/2014 1914   LABBENZ NONE DETECTED 04/07/2014 1914   AMPHETMU NONE DETECTED 04/07/2014 1914   THCU NONE DETECTED 04/07/2014 1914   LABBARB NONE DETECTED 04/07/2014 1914    No results for input(s): ETH in the last 168 hours.  Ct Head Wo Contrast 04/10/2014    No acute abnormality noted. No significant change from the prior study.      Ct Head Wo Contrast 04/09/2014    No acute intracranial process.  Mild to moderate white matter changes, advanced for age, better characterized on MRI of the brain from yesterday.    Mr Brain Wo Contrast 04/09/2014    Faint reduced diffusion RIGHT caudate body concerning for acute ischemia, less likely ex hyperacute demyelination.  Advanced white matter changes for  age, nonspecific and may reflect sequelae of chronic hypertension, migraine ; atypical distribution for demyelination.       Carotid Doppler  There is 1-39% bilateral ICA stenosis. Vertebral artery flow is antegrade.    2D Echocardiogram  EF 55-60% with no source of embolus.    PHYSICAL EXAM Obese young Caucasian male not in distress. . Afebrile. Head is nontraumatic. Neck is supple without bruit.    Cardiac exam no murmur or gallop. Lungs are clear to auscultation. Distal pulses are well felt. Neurological Exam :    Awake  Alert oriented x 3. Normal speech and language.eye movements full without nystagmus.fundi were not visualized. Vision acuity and fields appear normal. Hearing is normal. Palatal movements are normal. Face symmetric but mild nasolabial asymmetry when smiling.. Tongue midline. Normal strength, tone, reflexes and coordination. Normal sensation. Gait deferred.    ASSESSMENT/PLAN Mr. Charles Harding is a 49 y.o. male with history of cigarette smoking who presented with left sided tingling, then developed left sided numbness and hemiparesis in hospital. He received IV t-PA 04/09/2014 at 0539.   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 Repeat MRI w/ R caudate infarct  MRA  No large vessel occlusion  Carotid Doppler  No significant stenosis   2D Echo  No source of embolus   HgbA1c 4.8  SCDs for VTE prophylaxis  Diet regular. ST has reassessed and he has passed. To resume diet  no antithrombotic prior to admission, recevied aspirin in hospital 2/24 and 2/25, now on ASA 325 mg daily.  Ongoing aggressive stroke risk factor management  Transfer to 4N, for post tPA monitoring   Therapy recommendations:  No further therapy is recommended.  Disposition:  Anticipate return home  Hypertension  Home meds:    none  Stable  Received IV push for acute hypertension after placing foley  Long-term BP goal 130/90  Hyperlipidemia  Home meds:  No statin resumed in hospital  LDL 180, goal < 70  Lipitor 40 mg added during hopsitalization  Continue statin at discharge  Other Stroke Risk Factors  Cigarette smoker, advised to stop smoking  Other Active Problems  Remove Foley catheter today   PLAN  Probable discharge tomorrow    Hospital day # 3  RINEHULS, DAVID Wallace [facility] See Amion for Pager information 04/10/2014 9:14 AM    Pt has been seen and examined along with the practitioner. Note reviewed. Imaging and laboratory work reviewed.  Possible d/c tomorrow  Pauletta Browns   .  To contact Stroke Continuity provider, please refer to [email or web address] After hours, contact General Neurology

## 2014-04-10 NOTE — Evaluation (Signed)
Occupational Therapy Evaluation and Discharge Patient Details Name: Charles Harding MRN: [id number] DOB: 11/08/1965 Today's Date: 04/10/2014    History of Present Illness Patient reports that he noted tingling in his left arm, leg and face when he was at work at about 4:00 PM today (04/07/14); symptoms resolved. 04/09/14 left arm was numb and he was unable to move it. Per RN his voice grew more slurred until he was unable to to speak at all and he developed a major left facial droop.--resolved but then happened yet again--TPA adminstered 5:39am 04/09/14.   Clinical Impression   This 49 yo male admitted with above presents to acute OT at an independent level. No mobility or  BADL needs noted and we ran into PT in the hallway and she was able to see how well he was doing. Acute OT will sign off.    Follow Up Recommendations  No OT follow up    Equipment Recommendations  None recommended by OT       Precautions / Restrictions Precautions Precautions: None Restrictions Weight Bearing Restrictions: No      Mobility Bed Mobility Overal bed mobility: Independent                Transfers Overall transfer level: Independent                         ADL Overall ADL's : Independent                                             Vision Vision Assessment?: Yes Eye Alignment: Within Functional Limits Ocular Range of Motion: Within Functional Limits Alignment/Gaze Preference: Within Defined Limits Tracking/Visual Pursuits: Able to track stimulus in all quads without difficulty Saccades: Within functional limits Convergence: Within functional limits Visual Fields: No apparent deficits          Pertinent Vitals/Pain Pain Assessment: No/denies pain     Hand Dominance Right   Extremity/Trunk Assessment Upper Extremity Assessment Upper Extremity Assessment: Overall WFL for tasks assessed   Lower Extremity Assessment Lower Extremity Assessment:  Overall WFL for tasks assessed       Communication Communication Communication: No difficulties   Cognition Arousal/Alertness: Awake/alert Behavior During Therapy: WFL for tasks assessed/performed Overall Cognitive Status: Within Functional Limits for tasks assessed                                Home Living Family/patient expects to be discharged to:: Private residence Living Arrangements: Spouse/significant other Available Help at Discharge: Family Type of Home: House Home Access: Stairs to enter Secretary/administratorntrance Stairs-Number of Steps: 12 Entrance Stairs-Rails: Right Home Layout: Two level Alternate Level Stairs-Number of Steps: 12 Alternate Level Stairs-Rails: Right Bathroom Shower/Tub: Tub/shower unit;Door Shower/tub characteristics: Teacher, early years/preCurtain Bathroom Toilet: Standard     Home Equipment: None          Prior Functioning/Environment Level of Independence: Independent        Comments: Works as a Designer, jewellerycar parts transporter     OT Diagnosis: Generalized weakness         OT Goals(Current goals can be found in the care plan section) Acute Rehab OT Goals Patient Stated Goal: home soon  OT Frequency:                End of  Session Nurse Communication:  (Feel pt safe to be up and around unit with family)  Activity Tolerance: Patient tolerated treatment well Patient left:  (In bathroom)   Time: 9604-5409 OT Time Calculation (min): 20 min Charges:  OT General Charges $OT Visit: 1 Procedure OT Evaluation $Initial OT Evaluation Tier I: 1 Procedure  Evette Georges [phone] 04/10/2014, 3:45 PM

## 2014-04-11 DIAGNOSIS — G451 Carotid artery syndrome (hemispheric): Secondary | ICD-10-CM

## 2014-04-11 MED ORDER — AMLODIPINE BESYLATE 5 MG PO TABS
5.0000 mg | ORAL_TABLET | Freq: Every day | ORAL | Status: DC
  Administered 2014-04-11: 5 mg via ORAL
  Filled 2014-04-11: qty 1

## 2014-04-11 MED ORDER — ATORVASTATIN CALCIUM 40 MG PO TABS: 40.0000 mg | ORAL_TABLET | Freq: Every day | ORAL | Status: DC

## 2014-04-11 MED ORDER — AMLODIPINE BESYLATE 5 MG PO TABS: 5.0000 mg | ORAL_TABLET | Freq: Every day | ORAL | Status: DC

## 2014-04-11 MED ORDER — ASPIRIN 325 MG PO TBEC: 325.0000 mg | DELAYED_RELEASE_TABLET | Freq: Every day | ORAL | Status: AC

## 2014-04-11 NOTE — Discharge Summary (Signed)
Stroke Discharge Summary  Patient ID: Charles Harding   MRN: [id number]      DOB: 02-10-1966  Date of Admission: 04/07/2014 Date of Discharge: 04/11/2014  Attending Physician:  Delia Heady, MD, Stroke MD  Consulting Physician(s):     None  Patient's PCP:  No primary care provider on file.  DISCHARGE DIAGNOSIS: Right caudate infarct treated with TPA Principal Problem:   CVA (cerebral infarction) Active Problems:   Tobacco abuse   Elevated blood pressure   Hyperlipidemia LDL goal <70  BMI: Body mass index is 19.93 kg/(m^2).  History reviewed. No pertinent past medical history. History reviewed. No pertinent past surgical history.    Medication List    TAKE these medications        amLODipine 5 MG tablet  Commonly known as:  NORVASC  Take 1 tablet (5 mg total) by mouth daily.     aspirin 325 MG EC tablet  Take 1 tablet (325 mg total) by mouth daily.     atorvastatin 40 MG tablet  Commonly known as:  LIPITOR  Take 1 tablet (40 mg total) by mouth daily at 6 PM.        LABORATORY STUDIES CBC    Component Value Date/Time   WBC 6.6 04/07/2014 1736   RBC 4.34 04/07/2014 1736   HGB 15.3 04/07/2014 1742   HCT 45.0 04/07/2014 1742   PLT 202 04/07/2014 1736   MCV 91.7 04/07/2014 1736   MCH 30.0 04/07/2014 1736   MCHC 32.7 04/07/2014 1736   RDW 12.1 04/07/2014 1736   LYMPHSABS 2.5 04/07/2014 1736   MONOABS 0.5 04/07/2014 1736   EOSABS 0.1 04/07/2014 1736   BASOSABS 0.1 04/07/2014 1736   CMP    Component Value Date/Time   NA 140 04/07/2014 1742   K 3.8 04/07/2014 1742   CL 103 04/07/2014 1742   CO2 24 04/07/2014 1736   GLUCOSE 102* 04/07/2014 1742   BUN 11 04/07/2014 1742   CREATININE 1.10 04/07/2014 1742   CALCIUM 8.9 04/07/2014 1736   PROT 6.7 04/07/2014 1736   ALBUMIN 3.8 04/07/2014 1736   AST 24 04/07/2014 1736   ALT 20 04/07/2014 1736   ALKPHOS 63 04/07/2014 1736   BILITOT 0.5 04/07/2014 1736   GFRNONAA 77* 04/07/2014 1736   GFRAA 90* 04/07/2014  1736   COAGS Lab Results  Component Value Date   INR 0.94 04/07/2014   Lipid Panel    Component Value Date/Time   CHOL 240* 04/08/2014 0404   TRIG 86 04/08/2014 0404   HDL 43 04/08/2014 0404   CHOLHDL 5.6 04/08/2014 0404   VLDL 17 04/08/2014 0404   LDLCALC 180* 04/08/2014 0404   HgbA1C  Lab Results  Component Value Date   HGBA1C 4.8 04/08/2014   Cardiac Panel (last 3 results) No results for input(s): CKTOTAL, CKMB, TROPONINI, RELINDX in the last 72 hours. Urinalysis No results found for: COLORURINE, APPEARANCEUR, LABSPEC, PHURINE, GLUCOSEU, HGBUR, BILIRUBINUR, KETONESUR, PROTEINUR, UROBILINOGEN, NITRITE, LEUKOCYTESUR Urine Drug Screen     Component Value Date/Time   LABOPIA NONE DETECTED 04/07/2014 1914   COCAINSCRNUR NONE DETECTED 04/07/2014 1914   LABBENZ NONE DETECTED 04/07/2014 1914   AMPHETMU NONE DETECTED 04/07/2014 1914   THCU NONE DETECTED 04/07/2014 1914   LABBARB NONE DETECTED 04/07/2014 1914    Alcohol Level No results found for: [facility]   SIGNIFICANT DIAGNOSTIC STUDIES   Ct Head Wo Contrast 04/10/2014  No acute abnormality noted. No significant change from the prior study.  Ct Head Wo Contrast 04/09/2014  No acute intracranial process. Mild to moderate white matter changes, advanced for age, better characterized on MRI of the brain from yesterday.    CT head without contrast 04/07/2014 No acute intracranial abnormalities. Normal examination.    Mr Brain Wo Contrast 04/09/2014  Faint reduced diffusion RIGHT caudate body concerning for acute ischemia, less likely ex hyperacute demyelination. Advanced white matter changes for age, nonspecific and may reflect sequelae of chronic hypertension, migraine ; atypical distribution for demyelination.    MRI HEAD WITHOUT CONTRAST MRA HEAD WITHOUT CONTRAST 04/07/2014 1. Multiple periventricular and subcortical T2 hyperintensities bilaterally. These are greater than expected for age. The  finding is nonspecific but can be seen in the setting of chronic microvascular ischemia, a demyelinating process such as multiple sclerosis, vasculitis, complicated migraine headaches, or as the sequelae of a prior infectious or inflammatory process. 2. No acute intracranial abnormality. Specifically no evidence for acute infarct. 3. Normal MRA circle of Willis without evidence for significant proximal stenosis, aneurysm, or branch vessel occlusion.    Carotid Doppler There is 1-39% bilateral ICA stenosis. Vertebral artery flow is antegrade.   2D Echocardiogram EF 55-60% with no source of embolus.     HISTORY OF PRESENT ILLNESS Charles Harding is a 49 y.o. male who reports that he was at work 04/07/2014. Acutely noted tingling in his left arm and leg. His gait was difficult. He decided he should go home early and went to his car. Noted that he had tingling on the left side of his face as well and was drooling from the left side of his mouth. He was unable to operate his car. He asked someone to call EMS who brought him in as a code stoke. Patient began to improve en route. Initial NIHSS of 1.  Dr Delight Ovensamilo's note Code stroke was activated as patient informed his nurse around 2 am that his left arm was weak. Then he was noted to have left face droop and was not talking. Patient was immediately sent for CT brain which revealed no acute abnormality. While in the CT suite patient' symptoms resolved but came back again at the time I saw him in his room, with NIHSS 10. However, a few minutes later he was back to baseline again. I decided to obtain DWI MRI and after completion of MRI he became symptomatic again and thus I decided to administer IV tpa. MRI brain showed faint reduced diffusion RIGHT caudate body concerning for acute ischemia, less likely ex hyperacute demyelination. Patient was transferred to the NICU. Last seen normal at 2 am 04/09/13.   Date last known well: Date: 04/07/2014 Time  last known well: Time: 16:30 tPA Given: No: Improvement in symptoms  HOSPITAL COURSE The patient was treated with IV tPA 04/09/2014 at 0539. The patient's condition rapidly improved and he soon felt back to baseline. An EEG was performed and interpreted as normal. The patient was transferred out of the neuro intensive care unit to a regular floor on 04/09/2014. He reported feeling completely normal. The patient was evaluated by the physical, occupational, and speech therapists and no therapies were felt to be indicated. The patient was started on Lipitor for an elevated LDL. He was placed on Norvasc for hypertension. This may need further adjustment in outpatient basis. He was given aspirin 325 mg daily following a normal repeat CT scan 24 hours post TPA.   The importance of compliance with his medications was stressed on several occasions. It was also highly  recommended that the patient quit smoking and he agreed that he would. He declined nicotine patches. Arrangements were made to discharge the patient on 04/11/2014 in stable and improved condition. He will see Dr. Pearlean Brownie in 1 to 2 months. The images from the patient's MRI were reviewed with the patient prior to discharge per his request. The patient had some questions regarding the possibility of a demyelinating process noted on his MRI. It was suggested that he discuss this with Dr. Pearlean Brownie at his next office visit. It was also recommended that the patient become established with a primary care physician within one month for further assessment and treatment of his medical issues.  DISCHARGE EXAM Blood pressure 147/91, pulse 92, temperature 98 F (36.7 C), temperature source Oral, resp. rate 20, height 6' (1.829 m), weight 66.679 kg (147 lb), SpO2 95 %. 49 year old well nourished and well developed male in no acute distress. Afebrile. Head is nontraumatic. Neck is supple without bruit. Cardiac exam no murmur or gallop. Lungs are clear to auscultation.  Distal pulses are well felt. Neurological Exam :  Awake Alert oriented x 3. Normal speech and language.eye movements full without nystagmus.fundi were not visualized. Vision acuity and fields appear normal. Hearing is normal. Palatal movements are normal. Face symmetric but mild nasolabial asymmetry when smiling.. Tongue midline. Normal strength, tone, reflexes and coordination. Normal sensation. Gait deferred.   Discharge Diet   Diet regular with thin liquids  DISCHARGE PLAN  Disposition:  Discharged to home  aspirin 325 mg orally every day for secondary stroke prevention.  Follow-up No primary care provider on file. in 2 weeks.  Follow-up with Dr. Delia Heady, Stroke Clinic in 1 month.  40 minutes were spent preparing discharge.  Delton See PA-C [facility] Pager [phone] 04/11/2014, 4:46 PM

## 2014-04-11 NOTE — Discharge Instructions (Addendum)
Hypertension Hypertension is another name for high blood pressure. High blood pressure forces your heart to work harder to pump blood. A blood pressure reading has two numbers, which includes a higher number over a lower number (example: 110/72). HOME CARE   Have your blood pressure rechecked by your doctor.  Only take medicine as told by your doctor. Follow the directions carefully. The medicine does not work as well if you skip doses. Skipping doses also puts you at risk for problems.  Do not smoke.  Monitor your blood pressure at home as told by your doctor. GET HELP IF:  You think you are having a reaction to the medicine you are taking.  You have repeat headaches or feel dizzy.  You have puffiness (swelling) in your ankles.  You have trouble with your vision. GET HELP RIGHT AWAY IF:   You get a very bad headache and are confused.  You feel weak, numb, or faint.  You get chest or belly (abdominal) pain.  You throw up (vomit).  You cannot breathe very well. MAKE SURE YOU:   Understand these instructions.  Will watch your condition.  Will get help right away if you are not doing well or get worse. Document Released: 07/18/2007 Document Revised: 02/03/2013 Document Reviewed: 11/21/2012 [facility] Patient Information 2015 [place], [place]. This information is not intended to replace advice given to you by your health care provider. Make sure you discuss any questions you have with your health care provider. High Cholesterol High cholesterol refers to having a high level of cholesterol in your blood. Cholesterol is a white, waxy, fat-like protein that your body needs in small amounts. Your liver makes all the cholesterol you need. Excess cholesterol comes from the food you eat. Cholesterol travels in your bloodstream through your blood vessels. If you have high cholesterol, deposits (plaque) may build up on the walls of your blood vessels. This makes the arteries narrower and  stiffer. Plaque increases your risk of heart attack and stroke. Work with your health care provider to keep your cholesterol levels in a healthy range. RISK FACTORS Several things can make you more likely to have high cholesterol. These include:   Eating foods high in animal fat (saturated fat) or cholesterol.  Being overweight.  Not getting enough exercise.  Having a family history of high cholesterol. SIGNS AND SYMPTOMS High cholesterol does not cause symptoms. DIAGNOSIS  Your health care provider can do a blood test to check whether you have high cholesterol. If you are older than 20, your health care provider may check your cholesterol every 4-6 years. You may be checked more often if you already have high cholesterol or other risk factors for heart disease. The blood test for cholesterol measures the following:  Bad cholesterol (LDL cholesterol). This is the type of cholesterol that causes heart disease. This number should be less than 100.  Good cholesterol (HDL cholesterol). This type helps protect against heart disease. A healthy level of HDL cholesterol is 60 or higher.  Total cholesterol. This is the combined number of LDL cholesterol and HDL cholesterol. A healthy number is less than 200. TREATMENT  High cholesterol can be treated with diet changes, lifestyle changes, and medicine.   Diet changes may include eating more whole grains, fruits, vegetables, nuts, and fish. You may also have to cut back on red meat and foods with a lot of added sugar.  Lifestyle changes may include getting at least 40 minutes of aerobic exercise three times a week.  Aerobic exercises include walking, biking, and swimming. Aerobic exercise along with a healthy diet can help you maintain a healthy weight. Lifestyle changes may also include quitting smoking.  If diet and lifestyle changes are not enough to lower your cholesterol, your health care provider may prescribe a statin medicine. This medicine  has been shown to lower cholesterol and also lower the risk of heart disease. HOME CARE INSTRUCTIONS  Only take over-the-counter or prescription medicines as directed by your health care provider.   Follow a healthy diet as directed by your health care provider. For instance:   Eat chicken (without skin), fish, veal, shellfish, ground [place] breast, and round or loin cuts of red meat.  Do not eat fried foods and fatty meats, such as hot dogs and salami.   Eat plenty of fruits, such as apples.   Eat plenty of vegetables, such as broccoli, potatoes, and carrots.   Eat beans, peas, and lentils.   Eat grains, such as barley, rice, couscous, and bulgur wheat.   Eat pasta without cream sauces.   Use skim or nonfat milk and low-fat or nonfat yogurt and cheeses. Do not eat or drink whole milk, cream, ice cream, egg yolks, and hard cheeses.   Do not eat stick margarine or tub margarines that contain trans fats (also called partially hydrogenated oils).   Do not eat cakes, cookies, crackers, or other baked goods that contain trans fats.   Do not eat saturated tropical oils, such as coconut and palm oil.   Exercise as directed by your health care provider. Increase your activity level with activities such as gardening or walking.   Keep all follow-up appointments.  SEEK MEDICAL CARE IF:  You are struggling to maintain a healthy diet or weight.  You need help starting an exercise program.  You need help to stop smoking. SEEK IMMEDIATE MEDICAL CARE IF:  You have chest pain.  You have trouble breathing. Document Released: 01/29/2005 Document Revised: 06/15/2013 Document Reviewed: 11/21/2012 [facility] Patient Information 2015 [place], [place]. This information is not intended to replace advice given to you by your health care provider. Make sure you discuss any questions you have with your health care provider. Smoking Cessation Quitting smoking is important to your health  and has many advantages. However, it is not always easy to quit since nicotine is a very addictive drug. Oftentimes, people try 3 times or more before being able to quit. This document explains the best ways for you to prepare to quit smoking. Quitting takes hard work and a lot of effort, but you can do it. ADVANTAGES OF QUITTING SMOKING  You will live longer, feel better, and live better.  Your body will feel the impact of quitting smoking almost immediately.  Within 20 minutes, blood pressure decreases. Your pulse returns to its normal level.  After 8 hours, carbon monoxide levels in the blood return to normal. Your oxygen level increases.  After 24 hours, the chance of having a heart attack starts to decrease. Your breath, hair, and body stop smelling like smoke.  After 48 hours, damaged nerve endings begin to recover. Your sense of taste and smell improve.  After 72 hours, the body is virtually free of nicotine. Your bronchial tubes relax and breathing becomes easier.  After 2 to 12 weeks, lungs can hold more air. Exercise becomes easier and circulation improves.  The risk of having a heart attack, stroke, cancer, or lung disease is greatly reduced.  After 1 year, the risk of  coronary heart disease is cut in half.  After 5 years, the risk of stroke falls to the same as a nonsmoker.  After 10 years, the risk of lung cancer is cut in half and the risk of other cancers decreases significantly.  After 15 years, the risk of coronary heart disease drops, usually to the level of a nonsmoker.  If you are pregnant, quitting smoking will improve your chances of having a healthy baby.  The people you live with, especially any children, will be healthier.  You will have extra money to spend on things other than cigarettes. QUESTIONS TO THINK ABOUT BEFORE ATTEMPTING TO QUIT You may want to talk about your answers with your health care provider.  Why do you want to quit?  If you tried to  quit in the past, what helped and what did not?  What will be the most difficult situations for you after you quit? How will you plan to handle them?  Who can help you through the tough times? Your family? Friends? A health care provider?  What pleasures do you get from smoking? What ways can you still get pleasure if you quit? Here are some questions to ask your health care provider:  How can you help me to be successful at quitting?  What medicine do you think would be best for me and how should I take it?  What should I do if I need more help?  What is smoking withdrawal like? How can I get information on withdrawal? GET READY  Set a quit date.  Change your environment by getting rid of all cigarettes, ashtrays, matches, and lighters in your home, car, or work. Do not let people smoke in your home.  Review your past attempts to quit. Think about what worked and what did not. GET SUPPORT AND ENCOURAGEMENT You have a better chance of being successful if you have help. You can get support in many ways.  Tell your family, friends, and coworkers that you are going to quit and need their support. Ask them not to smoke around you.  Get individual, group, or telephone counseling and support. Programs are available at [employer] and health centers. Call your local health department for information about programs in your area.  Spiritual beliefs and practices may help some smokers quit.  Download a "quit meter" on your computer to keep track of quit statistics, such as how long you have gone without smoking, cigarettes not smoked, and money saved.  Get a self-help book about quitting smoking and staying off tobacco. LEARN NEW SKILLS AND BEHAVIORS  Distract yourself from urges to smoke. Talk to someone, go for a walk, or occupy your time with a task.  Change your normal routine. Take a different route to work. Drink tea instead of coffee. Eat breakfast in a different  place.  Reduce your stress. Take a hot bath, exercise, or read a book.  Plan something enjoyable to do every day. Reward yourself for not smoking.  Explore interactive web-based programs that specialize in helping you quit. GET MEDICINE AND USE IT CORRECTLY Medicines can help you stop smoking and decrease the urge to smoke. Combining medicine with the above behavioral methods and support can greatly increase your chances of successfully quitting smoking.  Nicotine replacement therapy helps deliver nicotine to your body without the negative effects and risks of smoking. Nicotine replacement therapy includes nicotine gum, lozenges, inhalers, nasal sprays, and skin patches. Some may be available over-the-counter and others require  a prescription.  Antidepressant medicine helps people abstain from smoking, but how this works is unknown. This medicine is available by prescription.  Nicotinic receptor partial agonist medicine simulates the effect of nicotine in your brain. This medicine is available by prescription. Ask your health care provider for advice about which medicines to use and how to use them based on your health history. Your health care provider will tell you what side effects to look out for if you choose to be on a medicine or therapy. Carefully read the information on the package. Do not use any other product containing nicotine while using a nicotine replacement product.  RELAPSE OR DIFFICULT SITUATIONS Most relapses occur within the first 3 months after quitting. Do not be discouraged if you start smoking again. Remember, most people try several times before finally quitting. You may have symptoms of withdrawal because your body is used to nicotine. You may crave cigarettes, be irritable, feel very hungry, cough often, get headaches, or have difficulty concentrating. The withdrawal symptoms are only temporary. They are strongest when you first quit, but they will go away within 10-14  days. To reduce the chances of relapse, try to:  Avoid drinking alcohol. Drinking lowers your chances of successfully quitting.  Reduce the amount of caffeine you consume. Once you quit smoking, the amount of caffeine in your body increases and can give you symptoms, such as a rapid heartbeat, sweating, and anxiety.  Avoid smokers because they can make you want to smoke.  Do not let weight gain distract you. Many smokers will gain weight when they quit, usually less than 10 pounds. Eat a healthy diet and stay active. You can always lose the weight gained after you quit.  Find ways to improve your mood other than smoking. FOR MORE INFORMATION  www.smokefree.gov  Document Released: 01/23/2001 Document Revised: 06/15/2013 Document Reviewed: 05/10/2011 [facility] Patient Information 2015 [place]. This information is not intended to replace advice given to you by your health care provider. Make sure you discuss any questions you have with your health care provider. Stroke Prevention Some health problems and behaviors may make it more likely for you to have a stroke. Below are ways to lessen your risk of having a stroke.   Be active for at least 30 minutes on most or all days.  Do not smoke. Try not to be around others who smoke.  Do not drink too much alcohol.  Do not have more than 2 drinks a day if you are a man.  Do not have more than 1 drink a day if you are a woman and are not pregnant.  Eat healthy foods, such as fruits and vegetables. If you were put on a specific diet, follow the diet as told.  Keep your cholesterol levels under control through diet and medicines. Look for foods that are low in saturated fat, trans fat, cholesterol, and are high in fiber.  If you have diabetes, follow all diet plans and take your medicine as told.  If you have high blood pressure (hypertension), follow all diet plans and take your medicine as told.  Keep a healthy weight. Eat foods that  are low in calories, salt, saturated fat, trans fat, and cholesterol.  Do not take drugs.  Avoid birth control pills, if this applies. Talk to your doctor about the risks of taking birth control pills.  Talk to your doctor if you have sleep problems (sleep apnea).  Take all medicine as told by your doctor.  You may be told to take aspirin or blood thinner medicine. Take this medicine as told by your doctor.  Understand your medicine instructions.  Make sure any other conditions you have are being taken care of. GET HELP RIGHT AWAY IF:  You suddenly lose feeling (you feel numb) or have weakness in your face, arm, or leg.  Your face or eyelid hangs down to one side.  You suddenly feel confused.  You have trouble talking (aphasia) or understanding what people are saying.  You suddenly have trouble seeing in one or both eyes.  You suddenly have trouble walking.  You are dizzy.  You lose your balance or your movements are clumsy (uncoordinated).  You suddenly have a very bad headache and you do not know the cause.  You have new chest pain.  Your heart feels like it is fluttering or skipping a beat (irregular heartbeat). Do not wait to see if the symptoms above go away. Get help right away. Call your local emergency services (911 in [place] Do not drive yourself to the hospital. Document Released: 07/31/2011 Document Revised: 06/15/2013 Document Reviewed: 08/01/2012 [facility] Patient Information 2015 [place], [place]. This information is not intended to replace advice given to you by your health care provider. Make sure you discuss any questions you have with your health care provider. Ischemic Stroke A stroke (cerebrovascular accident) is the sudden death of brain tissue. It is a medical emergency. A stroke can cause permanent loss of brain function. This can cause problems with different parts of your body. A transient ischemic attack (TIA) is different because it does not cause  permanent damage. A TIA is a short-lived problem of poor blood flow affecting a part of the brain. A TIA is also a serious problem because having a TIA greatly increases the chances of having a stroke. When symptoms first develop, you cannot know if the problem might be a stroke or a TIA. CAUSES  A stroke is caused by a decrease of oxygen supply to an area of your brain. It is usually the result of a small blood clot or collection of cholesterol or fat (plaque) that blocks blood flow in the brain. A stroke can also be caused by blocked or damaged carotid arteries.  RISK FACTORS  High blood pressure (hypertension).  High cholesterol.  Diabetes mellitus.  Heart disease.  The buildup of plaque in the blood vessels (peripheral artery disease or atherosclerosis).  The buildup of plaque in the blood vessels providing blood and oxygen to the brain (carotid artery stenosis).  An abnormal heart rhythm (atrial fibrillation).  Obesity.  Smoking.  Taking oral contraceptives (especially in combination with smoking).  Physical inactivity.  A diet high in fats, salt (sodium), and calories.  Alcohol use.  Use of illegal drugs (especially cocaine and methamphetamine).  Being African American.  Being over the age of 54.  Family history of stroke.  Previous history of blood clots, stroke, TIA, or heart attack.  Sickle cell disease. SYMPTOMS  These symptoms usually develop suddenly, or may be newly present upon awakening from sleep:  Sudden weakness or numbness of the face, arm, or leg, especially on one side of the body.  Sudden trouble walking or difficulty moving arms or legs.  Sudden confusion.  Sudden personality changes.  Trouble speaking (aphasia) or understanding.  Difficulty swallowing.  Sudden trouble seeing in one or both eyes.  Double vision.  Dizziness.  Loss of balance or coordination.  Sudden severe headache with no known cause.  Trouble reading or  writing. DIAGNOSIS  Your health care provider can often determine the presence or absence of a stroke based on your symptoms, history, and physical exam. Computed tomography (CT) of the brain is usually performed to confirm the stroke, determine causes, and determine stroke severity. Other tests may be done to find the cause of the stroke. These tests may include:  Electrocardiography.  Continuous heart monitoring.  Echocardiography.  Carotid ultrasonography.  Magnetic resonance imaging (MRI).  A scan of the brain circulation.  Blood tests. PREVENTION  The risk of a stroke can be decreased by appropriately treating high blood pressure, high cholesterol, diabetes, heart disease, and obesity and by quitting smoking, limiting alcohol, and staying physically active. TREATMENT  Time is of the essence. It is important to seek treatment at the first sign of these symptoms because you may receive a medicine to dissolve the clot (thrombolytic) that cannot be given if too much time has passed since your symptoms began. Even if you do not know when your symptoms began, get treatment as soon as possible as there are other treatment options available including oxygen, intravenous (IV) fluids, and medicines to thin the blood (anticoagulants). Treatment of stroke depends on the duration, severity, and cause of your symptoms. Medicines and dietary changes may be used to address diabetes, high blood pressure, and other risk factors. Physical, speech, and occupational therapists will assess you and work with you to improve any functions impaired by the stroke. Measures will be taken to prevent short-term and long-term complications, including infection from breathing foreign material into the lungs (aspiration pneumonia), blood clots in the legs, bedsores, and falls. Rarely, surgery may be needed to remove large blood clots or to open up blocked arteries. HOME CARE INSTRUCTIONS   Take medicines only as directed  by your health care provider. Follow the directions carefully. Medicines may be used to control risk factors for a stroke. Be sure you understand all your medicine instructions.  You may be told to take a medicine to thin the blood, such as aspirin or the anticoagulant warfarin. Warfarin needs to be taken exactly as instructed.  Too much and too little warfarin are both dangerous. Too much warfarin increases the risk of bleeding. Too little warfarin continues to allow the risk for blood clots. While taking warfarin, you will need to have regular blood tests to measure your blood clotting time. These blood tests usually include both the PT and INR tests. The PT and INR results allow your health care provider to adjust your dose of warfarin. The dose can change for many reasons. It is critically important that you take warfarin exactly as prescribed, and that you have your PT and INR levels drawn exactly as directed.  Many foods, especially foods high in vitamin K, can interfere with warfarin and affect the PT and INR results. Foods high in vitamin K include spinach, kale, broccoli, cabbage, collard and turnip greens, brussels sprouts, peas, cauliflower, seaweed, and parsley, as well as beef and pork liver, green tea, and soybean oil. You should eat a consistent amount of foods high in vitamin K. Avoid major changes in your diet, or notify your health care provider before changing your diet. Arrange a visit with a dietitian to answer your questions.  Many medicines can interfere with warfarin and affect the PT and INR results. You must tell your health care provider about any and all medicines you take. This includes all vitamins and supplements. Be especially cautious with aspirin and  anti-inflammatory medicines. Do not take or discontinue any prescribed or over-the-counter medicine except on the advice of your health care provider or pharmacist.  Warfarin can have side effects, such as excessive bruising  or bleeding. You will need to hold pressure over cuts for longer than usual. Your health care provider or pharmacist will discuss other potential side effects.  Avoid sports or activities that may cause injury or bleeding.  Be mindful when shaving, flossing your teeth, or handling sharp objects.  Alcohol can change the body's ability to handle warfarin. It is best to avoid alcoholic drinks or consume only very small amounts while taking warfarin. Notify your health care provider if you change your alcohol intake.  Notify your dentist or other health care providers before procedures.  If swallow studies have determined that your swallowing reflex is present, you should eat healthy foods. Including 5 or more servings of fruits and vegetables a day may reduce the risk of stroke. Foods may need to be a certain consistency (soft or pureed), or small bites may need to be taken in order to avoid aspirating or choking. Certain dietary changes may be advised to address high blood pressure, high cholesterol, diabetes, or obesity.  Food choices that are low in sodium, saturated fat, trans fat, and cholesterol are recommended to manage high blood pressure.  Food choies that are high in fiber, and low in saturated fat, trans fat, and cholesterol may control cholesterol levels.  Controlling carbohydrates and sugar intake is recommended to manage diabetes.  Reducing calorie intake and making food choices that are low in sodium, saturated fat, trans fat, and cholesterol are recommended to manage obesity.  Maintain a healthy weight.  Stay physically active. It is recommended that you get at least 30 minutes of activity on all or most days.  Do not use any tobacco products including cigarettes, chewing tobacco, or electronic cigarettes.  Limit alcohol use even if you are not taking warfarin. Moderate alcohol use is considered to be:  No more than 2 drinks each day for men.  No more than 1 drink each day  for nonpregnant women.  Home safety. A safe home environment is important to reduce the risk of falls. Your health care provider may arrange for specialists to evaluate your home. Having grab bars in the bedroom and bathroom is often important. Your health care provider may arrange for equipment to be used at home, such as raised toilets and a seat for the shower.  Physical, occupational, and speech therapy. Ongoing therapy may be needed to maximize your recovery after a stroke. If you have been advised to use a walker or a cane, use it at all times. Be sure to keep your therapy appointments.  Follow all instructions for follow-up with your health care provider. This is very important. This includes any referrals, physical therapy, rehabilitation, and lab tests. Proper follow-up can prevent another stroke from occurring. SEEK MEDICAL CARE IF:  You have personality changes.  You have difficulty swallowing.  You are seeing double.  You have dizziness.  You have a fever.  You have skin breakdown. SEEK IMMEDIATE MEDICAL CARE IF:  Any of these symptoms may represent a serious problem that is an emergency. Do not wait to see if the symptoms will go away. Get medical help right away. Call your local emergency services (911 in [place] Do not drive yourself to the hospital.  You have sudden weakness or numbness of the face, arm, or leg, especially on one side  of the body.  You have sudden trouble walking or difficulty moving arms or legs.  You have sudden confusion.  You have trouble speaking (aphasia) or understanding.  You have sudden trouble seeing in one or both eyes.  You have a loss of balance or coordination.  You have a sudden, severe headache with no known cause.  You have new chest pain or an irregular heartbeat.  You have a partial or total loss of consciousness. Document Released: 01/29/2005 Document Revised: 06/15/2013 Document Reviewed: 09/09/2011 [facility] Patient  Information 2015 [place], [place]. This information is not intended to replace advice given to you by your health care provider. Make sure you discuss any questions you have with your health care provider.    1) Find a primary care physician within one month for further monitoring of medical problems. 2) Be sure to quit smoking 3) Always take your medications as prescribed 4) Call Dr. Marlis Edelson office for any questions or concerns 5) Gradually increase your activity as tolerated

## 2014-04-11 NOTE — Progress Notes (Signed)
Pt d/c to home by car with family. Assessment stable. Pt verbalizes understanding of d/c instructions. 

## 2014-04-13 ENCOUNTER — Telehealth: Payer: Self-pay | Admitting: Neurology

## 2014-04-13 ENCOUNTER — Encounter: Payer: Self-pay | Admitting: Neurology

## 2014-04-13 NOTE — Telephone Encounter (Signed)
I called patient. The patient apparently came in earlier today, Dr. Roda ShuttersXU gave him a note for work.

## 2014-04-13 NOTE — Telephone Encounter (Signed)
Patient is calling as he needs a letter so he can come back to work with no restrictions with release date. Please call.

## 2014-04-13 NOTE — Telephone Encounter (Signed)
Patient requesting a return to work note, patient was seen by Dr Pearlean BrownieSethi in the hospital and was discharged on 03/25/14, patient has an upcoming appointment with Dr Anne HahnWillis on 05/10/14. Dr Anne HahnWillis please advise.

## 2014-04-21 ENCOUNTER — Telehealth: Payer: Self-pay | Admitting: *Deleted

## 2014-04-21 DIAGNOSIS — Z0289 Encounter for other administrative examinations: Secondary | ICD-10-CM

## 2014-04-21 NOTE — Telephone Encounter (Signed)
Form, [employer] sent to [employer] and Dr Roda ShuttersXu 04-21-14.

## 2014-04-23 NOTE — Telephone Encounter (Signed)
Form recieved

## 2014-04-26 NOTE — Telephone Encounter (Signed)
Patient showed up in the office today requesting forms, patient was informed of [employer]'s policy "14 business days to complete forms", patient understood and has requested a letter stating that he can go back to work this week. Dr Roda ShuttersXu please advise.

## 2014-04-27 NOTE — Telephone Encounter (Signed)
Hi, Tashia:  Dr. Pearlean BrownieSethi saw and discharged this patient in hospital on 04/11/14. I did not involved in this patient's care. However, after discharge, he showed up in hospital on 04/13/14 asking for letter going back to work. I was in hospital at week and I was paged to give him a letter by administration. I provided a letter for him going back to work on 04/26/14.   Therefore, I think Dr. Pearlean BrownieSethi will be the best person to fill out his paperworks. Please ask Dr. Pearlean BrownieSethi for help. Thanks a lot.  Marvel PlanJindong Nicole Hafley, MD PhD Stroke Neurology 04/27/2014 9:54 PM

## 2014-04-28 NOTE — Telephone Encounter (Signed)
Thanks. I will be happy to do so.

## 2014-04-29 ENCOUNTER — Ambulatory Visit (INDEPENDENT_AMBULATORY_CARE_PROVIDER_SITE_OTHER): Payer: BLUE CROSS/BLUE SHIELD | Admitting: Physician Assistant

## 2014-04-29 VITALS — BP 132/88 | HR 81 | Temp 97.4°F | Resp 16 | Ht 72.0 in | Wt 168.0 lb

## 2014-04-29 DIAGNOSIS — I10 Essential (primary) hypertension: Secondary | ICD-10-CM | POA: Diagnosis not present

## 2014-04-29 DIAGNOSIS — E785 Hyperlipidemia, unspecified: Secondary | ICD-10-CM

## 2014-04-29 DIAGNOSIS — G459 Transient cerebral ischemic attack, unspecified: Secondary | ICD-10-CM

## 2014-04-29 NOTE — Patient Instructions (Signed)
Keep up the good work!  Please continue to take your blood pressure medication, cholesterol medication, and aspirin every day.  Keep up the good work with diet and exercise.  Please give [place] a call when you're running out of your meds and I'll give you a 3 month refill.  Let's plan to see you back in 4-5 months for follow up. Remember you have your neurology appointment in 11 days.   Transient Ischemic Attack A transient ischemic attack (TIA) is a "warning stroke" that causes stroke-like symptoms. Unlike a stroke, a TIA does not cause permanent damage to the brain. The symptoms of a TIA can happen very fast and do not last long. It is important to know the symptoms of a TIA and what to do. This can help prevent a major stroke or death. CAUSES   A TIA is caused by a temporary blockage in an artery in the brain or neck (carotid artery). The blockage does not allow the brain to get the blood supply it needs and can cause different symptoms. The blockage can be caused by either:  A blood clot.  Fatty buildup (plaque) in a neck or brain artery. RISK FACTORS  High blood pressure (hypertension).  High cholesterol.  Diabetes mellitus.  Heart disease.  The build up of plaque in the blood vessels (peripheral artery disease or atherosclerosis).  The build up of plaque in the blood vessels providing blood and oxygen to the brain (carotid artery stenosis).  An abnormal heart rhythm (atrial fibrillation).  Obesity.  Smoking.  Taking oral contraceptives (especially in combination with smoking).  Physical inactivity.  A diet high in fats, salt (sodium), and calories.  Alcohol use.  Use of illegal drugs (especially cocaine and methamphetamine).  Being male.  Being African American.  Being over the age of 80.  Family history of stroke.  Previous history of blood clots, stroke, TIA, or heart attack.  Sickle cell disease. SYMPTOMS  TIA symptoms are the same as a stroke but are  temporary. These symptoms usually develop suddenly, or may be newly present upon awakening from sleep:  Sudden weakness or numbness of the face, arm, or leg, especially on one side of the body.  Sudden trouble walking or difficulty moving arms or legs.  Sudden confusion.  Sudden personality changes.  Trouble speaking (aphasia) or understanding.  Difficulty swallowing.  Sudden trouble seeing in one or both eyes.  Double vision.  Dizziness.  Loss of balance or coordination.  Sudden severe headache with no known cause.  Trouble reading or writing.  Loss of bowel or bladder control.  Loss of consciousness. DIAGNOSIS  Your caregiver may be able to determine the presence or absence of a TIA based on your symptoms, history, and physical exam. Computed tomography (CT scan) of the brain is usually performed to help identify a TIA. Other tests may be done to diagnose a TIA. These tests may include:  Electrocardiography.  Continuous heart monitoring.  Echocardiography.  Carotid ultrasonography.  Magnetic resonance imaging (MRI).  A scan of the brain circulation.  Blood tests. PREVENTION  The risk of a TIA can be decreased by appropriately treating high blood pressure, high cholesterol, diabetes, heart disease, and obesity and by quitting smoking, limiting alcohol, and staying physically active. TREATMENT  Time is of the essence. Since the symptoms of TIA are the same as a stroke, it is important to seek treatment as soon as possible because you may need a medicine to dissolve the clot (thrombolytic) that cannot  be given if too much time has passed. Treatment options vary. Treatment options may include rest, oxygen, intravenous (IV) fluids, and medicines to thin the blood (anticoagulants). Medicines and diet may be used to address diabetes, high blood pressure, and other risk factors. Measures will be taken to prevent short-term and long-term complications, including infection  from breathing foreign material into the lungs (aspiration pneumonia), blood clots in the legs, and falls. Treatment options include procedures to either remove plaque in the carotid arteries or dilate carotid arteries that have narrowed due to plaque. Those procedures are:  Carotid endarterectomy.  Carotid angioplasty and stenting. HOME CARE INSTRUCTIONS   Take all medicines prescribed by your caregiver. Follow the directions carefully. Medicines may be used to control risk factors for a stroke. Be sure you understand all your medicine instructions.  You may be told to take aspirin or the anticoagulant warfarin. Warfarin needs to be taken exactly as instructed.  Taking too much or too little warfarin is dangerous. Too much warfarin increases the risk of bleeding. Too little warfarin continues to allow the risk for blood clots. While taking warfarin, you will need to have regular blood tests to measure your blood clotting time. A PT blood test measures how long it takes for blood to clot. Your PT is used to calculate another value called an INR. Your PT and INR help your caregiver to adjust your dose of warfarin. The dose can change for many reasons. It is critically important that you take warfarin exactly as prescribed.  Many foods, especially foods high in vitamin K can interfere with warfarin and affect the PT and INR. Foods high in vitamin K include spinach, kale, broccoli, cabbage, collard and turnip greens, brussels sprouts, peas, cauliflower, seaweed, and parsley as well as beef and pork liver, green tea, and soybean oil. You should eat a consistent amount of foods high in vitamin K. Avoid major changes in your diet, or notify your caregiver before changing your diet. Arrange a visit with a dietitian to answer your questions.  Many medicines can interfere with warfarin and affect the PT and INR. You must tell your caregiver about any and all medicines you take, this includes all vitamins and  supplements. Be especially cautious with aspirin and anti-inflammatory medicines. Do not take or discontinue any prescribed or over-the-counter medicine except on the advice of your caregiver or pharmacist.  Warfarin can have side effects, such as excessive bruising or bleeding. You will need to hold pressure over cuts for longer than usual. Your caregiver or pharmacist will discuss other potential side effects.  Avoid sports or activities that may cause injury or bleeding.  Be mindful when shaving, flossing your teeth, or handling sharp objects.  Alcohol can change the body's ability to handle warfarin. It is best to avoid alcoholic drinks or consume only very small amounts while taking warfarin. Notify your caregiver if you change your alcohol intake.  Notify your dentist or other caregivers before procedures.  Eat a diet that includes 5 or more servings of fruits and vegetables each day. This may reduce the risk of stroke. Certain diets may be prescribed to address high blood pressure, high cholesterol, diabetes, or obesity.  A low-sodium, low-saturated fat, low-trans fat, low-cholesterol diet is recommended to manage high blood pressure.  A low-saturated fat, low-trans fat, low-cholesterol, and high-fiber diet may control cholesterol levels.  A controlled-carbohydrate, controlled-sugar diet is recommended to manage diabetes.  A reduced-calorie, low-sodium, low-saturated fat, low-trans fat, low-cholesterol diet is recommended to  manage obesity.  Maintain a healthy weight.  Stay physically active. It is recommended that you get at least 30 minutes of activity on most or all days.  Do not smoke.  Limit alcohol use even if you are not taking warfarin. Moderate alcohol use is considered to be:  No more than 2 drinks each day for men.  No more than 1 drink each day for nonpregnant women.  Stop drug abuse.  Home safety. A safe home environment is important to reduce the risk of  falls. Your caregiver may arrange for specialists to evaluate your home. Having grab bars in the bedroom and bathroom is often important. Your caregiver may arrange for equipment to be used at home, such as raised toilets and a seat for the shower.  Follow all instructions for follow-up with your caregiver. This is very important. This includes any referrals and lab tests. Proper follow up can prevent a stroke or another TIA from occurring. SEEK MEDICAL CARE IF:  You have personality changes.  You have difficulty swallowing.  You are seeing double.  You have dizziness.  You have a fever.  You have skin breakdown. SEEK IMMEDIATE MEDICAL CARE IF:  Any of these symptoms may represent a serious problem that is an emergency. Do not wait to see if the symptoms will go away. Get medical help right away. Call your local emergency services (911 in [place] Do not drive yourself to the hospital.  You have sudden weakness or numbness of the face, arm, or leg, especially on one side of the body.  You have sudden trouble walking or difficulty moving arms or legs.  You have sudden confusion.  You have trouble speaking (aphasia) or understanding.  You have sudden trouble seeing in one or both eyes.  You have a loss of balance or coordination.  You have a sudden, severe headache with no known cause.  You have new chest pain or an irregular heartbeat.  You have a partial or total loss of consciousness. MAKE SURE YOU:   Understand these instructions.  Will watch your condition.  Will get help right away if you are not doing well or get worse. Document Released: 11/08/2004 Document Revised: 02/03/2013 Document Reviewed: 05/06/2013 [facility] Patient Information 2015 [place], [place]. This information is not intended to replace advice given to you by your health care provider. Make sure you discuss any questions you have with your health care provider.

## 2014-04-29 NOTE — Progress Notes (Signed)
Subjective:    Patient ID: Charles Harding, male    DOB: Nov 04, 1965, 49 y.o.   MRN: [id number]  Chief Complaint  Patient presents with  . Follow-up    TIA on 2/24   Patient Active Problem List   Diagnosis Date Noted  . Hyperlipidemia LDL goal <70 04/09/2014  . CVA (cerebral infarction) 04/07/2014  . Tobacco abuse 04/07/2014  . Elevated blood pressure 04/07/2014   Prior to Admission medications   Medication Sig Start Date End Date Taking? Authorizing Provider  amLODipine (NORVASC) 5 MG tablet Take 1 tablet (5 mg total) by mouth daily. 04/11/14  Yes David L Rinehuls, PA-C  aspirin EC 325 MG EC tablet Take 1 tablet (325 mg total) by mouth daily. 04/11/14  Yes David L Rinehuls, PA-C  atorvastatin (LIPITOR) 40 MG tablet Take 1 tablet (40 mg total) by mouth daily at 6 PM. 04/11/14  Yes David L Rinehuls, PA-C   Medications, allergies, past medical history, surgical history, family history, social history and problem list reviewed and updated.  HPI  49 yom with pmh recent TIA presents to establish primary care.   Seen at [facility] with left sided tingling, weakness, drooling 2/24. Had an acute worsening while hospitalized so received TPA. Had normal head CT. Had MRI 2/26 which showed right lesion concerning for acute ischemia as well as advanced white matter changes for age. Diagnosed with TIA. Carotid dopplers were normal. Echo EF 55-60% no embolus. Was evaluated by physical, occupational, and speech therapy and no therapies were felt to be indicated. Discharged on 5 mg amlodipine, 40 mg lipitor for LDL 180, and 325 mg asa. Was encouraged to quit smoking.   Today states has been doing well since dc. Taking lipitor daily with no muscles aches, myaglias. Taking the amlodipine daily. Taking 325 asa daily. Is checking his bp at [facility] every other day. Running 110s/70s. No readings over 140/90.   He is walking 3x week. Approx 5 miles day. Planning to increase to 4-5 days soon. Quit smoking cold  [place] on 2/24. Has not had one cigarette since. No longer adding salt to meals. Has cut out fried foods.   Denies cp, sob, palps with activity. Denies any left sided residual effects or worsening mood.   Has f/u scheduled with neuro in 11 days to further discuss MRI findings.   Review of Systems See HPI.     Objective:   Physical Exam  Constitutional: He is oriented to person, place, and time. He appears well-developed and well-nourished.  Non-toxic appearance. He does not have a sickly appearance. He does not appear ill. No distress.  BP 132/88 mmHg  Pulse 81  Temp(Src) 97.4 F (36.3 C) (Oral)  Resp 16  Ht 6' (1.829 m)  Wt 168 lb (76.204 kg)  BMI 22.78 kg/m2  SpO2 97%   Neck: Carotid bruit is not present.  Cardiovascular: Normal rate, regular rhythm and normal heart sounds.   Neurological: He is alert and oriented to person, place, and time. He has normal strength. No cranial nerve deficit or sensory deficit. He displays a negative Romberg sign. Coordination and gait normal.  Reflex Scores:      Patellar reflexes are 2+ on the right side and 2+ on the left side. Psychiatric: He has a normal mood and affect. His speech is normal and behavior is normal.      Assessment & Plan:   49 yom with pmh recent TIA presents to establish primary care.   Transient cerebral ischemia,  unspecified transient cerebral ischemia type Hyperlipidemia Essential hypertension --bp good today --taking all meds as prescribed, has quit smoking, is watching diet, exercising 3x week, encouraged to keep up good work --fu with neuro 11 days --pt to call clinic when meds run out, will send in 3 month refill --no residual effects on neuro exam today --plan to f/u with us 5-6 months, sooner for any issues  Donnajean Lopesodd M. Indira Sorenson, PA-C Physician Assistant-Certified [facility]  04/29/2014 12:45 PM

## 2014-05-04 ENCOUNTER — Telehealth: Payer: Self-pay | Admitting: Neurology

## 2014-05-04 ENCOUNTER — Encounter: Payer: Self-pay | Admitting: Neurology

## 2014-05-04 NOTE — Telephone Encounter (Signed)
Patient stated employer is requesting an additional letter stating he has no restrictions to return back to work.  Please call and advise.

## 2014-05-04 NOTE — Telephone Encounter (Signed)
I called the patient. The patient had episode of left-sided numbness and some weakness. He had no definite acute stroke changes on MRI, he did received TPA. The patient was discharged on 04/11/2014, he has been doing well over the last 3 weeks, he wishes to go back to work. The patient has no residual deficits. His work entails delivering auto parts, he drives a car during the day, no heavy lifting. I will write a letter allowing him go back to work.

## 2014-05-04 NOTE — Telephone Encounter (Signed)
Patient was seen by Dr Pearlean BrownieSethi in the hospital and was addressed to Dr Roda ShuttersXu, patient has not been seen by Dr Roda ShuttersXu, and has an appointment with Dr Anne HahnWillis for 05/10/14 at 2 pm, form has been forwarded to Dr Anne HahnWillis' nurse Earney NavyKelby.

## 2014-05-04 NOTE — Telephone Encounter (Signed)
Patient is being followed by Dr Anne HahnWillis, will send to Dr Anne HahnWillis.

## 2014-05-04 NOTE — Telephone Encounter (Signed)
thanks

## 2014-05-10 ENCOUNTER — Ambulatory Visit (INDEPENDENT_AMBULATORY_CARE_PROVIDER_SITE_OTHER): Payer: BLUE CROSS/BLUE SHIELD | Admitting: Neurology

## 2014-05-10 ENCOUNTER — Encounter: Payer: Self-pay | Admitting: Neurology

## 2014-05-10 VITALS — BP 146/85 | HR 90 | Ht 72.0 in | Wt 172.0 lb

## 2014-05-10 DIAGNOSIS — R030 Elevated blood-pressure reading, without diagnosis of hypertension: Secondary | ICD-10-CM

## 2014-05-10 DIAGNOSIS — I6300 Cerebral infarction due to thrombosis of unspecified precerebral artery: Secondary | ICD-10-CM | POA: Diagnosis not present

## 2014-05-10 DIAGNOSIS — IMO0001 Reserved for inherently not codable concepts without codable children: Secondary | ICD-10-CM

## 2014-05-10 MED ORDER — ATORVASTATIN CALCIUM 40 MG PO TABS
40.0000 mg | ORAL_TABLET | Freq: Every day | ORAL | Status: AC
Start: 2014-05-10 — End: ?

## 2014-05-10 MED ORDER — AMLODIPINE BESYLATE 5 MG PO TABS: 5.0000 mg | ORAL_TABLET | Freq: Every day | ORAL | Status: AC

## 2014-05-10 NOTE — Patient Instructions (Signed)
Stroke Prevention Some health problems and behaviors may make it more likely for you to have a stroke. Below are ways to lessen your risk of having a stroke.   Be active for at least 30 minutes on most or all days.  Do not smoke. Try not to be around others who smoke.  Do not drink too much alcohol.  Do not have more than 2 drinks a day if you are a man.  Do not have more than 1 drink a day if you are a woman and are not pregnant.  Eat healthy foods, such as fruits and vegetables. If you were put on a specific diet, follow the diet as told.  Keep your cholesterol levels under control through diet and medicines. Look for foods that are low in saturated fat, trans fat, cholesterol, and are high in fiber.  If you have diabetes, follow all diet plans and take your medicine as told.  If you have high blood pressure (hypertension), follow all diet plans and take your medicine as told.  Keep a healthy weight. Eat foods that are low in calories, salt, saturated fat, trans fat, and cholesterol.  Do not take drugs.  Avoid birth control pills, if this applies. Talk to your doctor about the risks of taking birth control pills.  Talk to your doctor if you have sleep problems (sleep apnea).  Take all medicine as told by your doctor.  You may be told to take aspirin or blood thinner medicine. Take this medicine as told by your doctor.  Understand your medicine instructions.  Make sure any other conditions you have are being taken care of. GET HELP RIGHT AWAY IF:  You suddenly lose feeling (you feel numb) or have weakness in your face, arm, or leg.  Your face or eyelid hangs down to one side.  You suddenly feel confused.  You have trouble talking (aphasia) or understanding what people are saying.  You suddenly have trouble seeing in one or both eyes.  You suddenly have trouble walking.  You are dizzy.  You lose your balance or your movements are clumsy (uncoordinated).  You  suddenly have a very bad headache and you do not know the cause.  You have new chest pain.  Your heart feels like it is fluttering or skipping a beat (irregular heartbeat). Do not wait to see if the symptoms above go away. Get help right away. Call your local emergency services (911 in [place] Do not drive yourself to the hospital. Document Released: 07/31/2011 Document Revised: 06/15/2013 Document Reviewed: 08/01/2012 [employer] Patient Information 2015 [employer]. This information is not intended to replace advice given to you by your health care provider. Make sure you discuss any questions you have with your health care provider.  

## 2014-05-10 NOTE — Progress Notes (Signed)
Reason for visit: Recent stroke  Referring physician: [facility]  Charles Harding is a 49 y.o. male  History of present illness:  Charles Harding is a 49 year old right-handed black male with a history of untreated hypertension and dyslipidemia. The patient was admitted to the hospital on 04/07/14 with left-sided tingling, left facial droop, and the hospital records were reviewed online. The patient appeared to improve with his deficits rapidly, and for that reason TPA was not given initially. However, following admission, the patient seemed to worsen overnight, with more prominent left-sided features of numbness and weakness. The patient was given TPA at a time when his NIH stroke scale score was 10. The patient has undergone MRI evaluation and MRA of the head. MRA was unremarkable, MRI the brain shows a mildly diffusion weighted positive lesion in the right centrum semiovale. The patient underwent carotid Doppler studies that were unremarkable, and a 2-D echocardiogram that did not show evidence of a cardioembolic source of stroke. The patient has been placed on aspirin, and a cholesterol lowering agent, and he was placed on medications for blood pressure. On admission, his blood pressure was 172/111. The patient has no primary care physician. He comes to this office for an evaluation. He was smoking 2 cigars a day, he has stopped doing this. Currently, the patient believes that he is back to his usual baseline, with no residual symptoms.  Past Medical History  Diagnosis Date  . Stroke     TIA 2/16  . Hypertension   . Dyslipidemia     History reviewed. No pertinent past surgical history.  Family History  Problem Relation Age of Onset  . Diabetes Mother   . Hypertension Mother   . Stroke Mother   . Heart disease Mother   . Hyperlipidemia Father   . Hypertension Father   . Healthy Sister   . Healthy Brother   . Healthy Sister     Social history:  reports that he quit smoking about  4 weeks ago. He has never used smokeless tobacco. He reports that he drinks alcohol. He reports that he does not use illicit drugs.  Medications:  Prior to Admission medications   Medication Sig Start Date End Date Taking? Authorizing Provider  amLODipine (NORVASC) 5 MG tablet Take 1 tablet (5 mg total) by mouth daily. 04/11/14  Yes David L Rinehuls, PA-C  aspirin EC 325 MG EC tablet Take 1 tablet (325 mg total) by mouth daily. 04/11/14  Yes David L Rinehuls, PA-C  atorvastatin (LIPITOR) 40 MG tablet Take 1 tablet (40 mg total) by mouth daily at 6 PM. 04/11/14  Yes David L Rinehuls, PA-C     No Known Allergies  ROS:  Out of a complete 14 system review of symptoms, the patient complains only of the following symptoms, and all other reviewed systems are negative.  Transient numbness, weakness  Blood pressure 146/85, pulse 90, height 6' (1.829 m), weight 172 lb (78.019 kg).  Physical Exam  General: The patient is alert and cooperative at the time of the examination.  Eyes: Pupils are equal, round, and reactive to light. Discs are flat bilaterally.  Neck: The neck is supple, no carotid bruits are noted.  Respiratory: The respiratory examination is clear.  Cardiovascular: The cardiovascular examination reveals a regular rate and rhythm, no obvious murmurs or rubs are noted.  Skin: Extremities are without significant edema.  Neurologic Exam  Mental status: The patient is alert and oriented x 3 at the time of the  examination. The patient has apparent normal recent and remote memory, with an apparently normal attention span and concentration ability.  Cranial nerves: Facial symmetry is present. There is good sensation of the face to pinprick and soft touch bilaterally. The strength of the facial muscles and the muscles to head turning and shoulder shrug are normal bilaterally. Speech is well enunciated, no aphasia or dysarthria is noted. Extraocular movements are full. Visual fields are  full. The tongue is midline, and the patient has symmetric elevation of the soft palate. No obvious hearing deficits are noted.  Motor: The motor testing reveals 5 over 5 strength of all 4 extremities. Good symmetric motor tone is noted throughout.  Sensory: Sensory testing is intact to pinprick, soft touch, vibration sensation, and position sense on all 4 extremities. No evidence of extinction is noted.  Coordination: Cerebellar testing reveals good finger-nose-finger and heel-to-shin bilaterally.  Gait and station: Gait is normal. Tandem gait is normal. Romberg is negative. No drift is seen.  Reflexes: Deep tendon reflexes are symmetric and normal bilaterally. Toes are downgoing bilaterally.   MRI brain 04/09/14:  IMPRESSION: Faint reduced diffusion RIGHT caudate body concerning for acute ischemia, less likely ex hyperacute demyelination.  Advanced white matter changes for age, nonspecific and may reflect sequelae of chronic hypertension, migraine ; atypical distribution for demyelination.  * MRI scan images were reviewed online. I agree with the written report.   Carotid Doppler 04/08/14:  Summary: Bilateral - No evidence of ICA stenosis. Vertebral artery flow is Antegrade.   2-D echocardiogram 04/08/14:  Study Conclusions  - Left ventricle: The cavity size was normal. Systolic function was normal. The estimated ejection fraction was in the range of 55% to 60%. Wall motion was normal; there were no regional wall motion abnormalities. There was no evidence of elevated ventricular filling pressure by Doppler parameters.  Impressions:  - No cardiac source of emboli was indentified.    Assessment/Plan:  1. Recent right brain stroke  2. Hypertension  3. Dyslipidemia  4. Tobacco abuse  The patient has done well following the recent hospitalization. He likely had a small vessel ischemic event related to very high blood pressures. He is now on medications  for blood pressure, doing better in this regard. He will remained on aspirin and his Lipitor. I will refer him to a primary care physician. I will give him prescriptions for the Lipitor and the Norvasc. He will follow-up in about 6 months.  Marlan Palau. Keith Willis MD 05/10/2014 7:29 PM  [facility] [address] [place], [place] [id number]  Phone [phone] Fax [phone]

## 2014-05-13 ENCOUNTER — Telehealth: Payer: Self-pay | Admitting: *Deleted

## 2014-05-13 DIAGNOSIS — Z0289 Encounter for other administrative examinations: Secondary | ICD-10-CM

## 2014-05-13 NOTE — Telephone Encounter (Signed)
Forms,[employer] received,completed by Dr Roda ShuttersXu and Andreas Blowerashia at front desk for patient 05-12-14.

## 2014-05-13 NOTE — Telephone Encounter (Signed)
[facility],[employer] [facility] Anne HahnWillis 05-13-14.

## 2014-05-17 NOTE — Telephone Encounter (Signed)
Form,[employer] received,completed by Dr Anne HahnWillis and Earney NavyKelby faxed 05-17-14.

## 2014-11-01 ENCOUNTER — Ambulatory Visit (INDEPENDENT_AMBULATORY_CARE_PROVIDER_SITE_OTHER): Payer: BLUE CROSS/BLUE SHIELD | Admitting: Nurse Practitioner

## 2014-11-01 ENCOUNTER — Encounter: Payer: Self-pay | Admitting: Nurse Practitioner

## 2014-11-01 VITALS — BP 145/98 | HR 73 | Ht 72.0 in | Wt 177.2 lb

## 2014-11-01 DIAGNOSIS — Z720 Tobacco use: Secondary | ICD-10-CM

## 2014-11-01 DIAGNOSIS — R030 Elevated blood-pressure reading, without diagnosis of hypertension: Secondary | ICD-10-CM | POA: Diagnosis not present

## 2014-11-01 DIAGNOSIS — E785 Hyperlipidemia, unspecified: Secondary | ICD-10-CM | POA: Diagnosis not present

## 2014-11-01 DIAGNOSIS — IMO0001 Reserved for inherently not codable concepts without codable children: Secondary | ICD-10-CM

## 2014-11-01 DIAGNOSIS — I6300 Cerebral infarction due to thrombosis of unspecified precerebral artery: Secondary | ICD-10-CM

## 2014-11-01 NOTE — Patient Instructions (Addendum)
[address]  [place] [phone] Dr. Cliffton Asters Keep B/P less than 130/90 todays reading 145/98 Continue Lipitor for cholesterol Continue aspirin for secondary stroke prevention Congratulations on stopping smoking Follow-up in 6 months

## 2014-11-01 NOTE — Progress Notes (Addendum)
[facility]  PATIENT: Charles Harding DOB: [birth year]   REASON FOR VISIT: Stroke 04/07/2014, hyperlipidemia hypertension HISTORY FROM: Patient    HISTORY OF PRESENT ILLNESS: Charles Harding, 49 year old black male returns for follow-up. He had a stroke and was admitted to the hospital 04/07/2014 with left-sided tingling left facial droop. Carotid Doppler unremarkable, 2-D echo did not show cardioembolic source. MRI of the brain showed right centrum semiovale lesion. He was placed on aspirin, Lipitor and Norvasc. He tells me today he has not been taking his aspirin as the prescription was never sent from his mail order. He was made aware this is an over-the-counter medication and is to prevent having another stroke. He was made aware that his risk for stroke first year is 10-15%. He has stopped smoking. He is back to work, he has not had any further stroke or TIA symptoms. He returns for reevaluation   HISTORY: Charles Harding is a 49 year old right-handed black male with a history of untreated hypertension and dyslipidemia. The patient was admitted to the hospital on 04/07/14 with left-sided tingling, left facial droop, and the hospital records were reviewed online. The patient appeared to improve with his deficits rapidly, and for that reason TPA was not given initially. However, following admission, the patient seemed to worsen overnight, with more prominent left-sided features of numbness and weakness. The patient was given TPA at a time when his NIH stroke scale score was 10. The patient has undergone MRI evaluation and MRA of the head. MRA was unremarkable, MRI the brain shows a mildly diffusion weighted positive lesion in the right centrum semiovale. The patient underwent carotid Doppler studies that were unremarkable, and a 2-D echocardiogram that did not show evidence of a cardioembolic source of stroke. The patient has been placed on aspirin, and a cholesterol lowering agent,  and he was placed on medications for blood pressure. On admission, his blood pressure was 172/111. The patient has no primary care physician. He comes to this office for an evaluation. He was smoking 2 cigars a day, he has stopped doing this. Currently, the patient believes that he is back to his usual baseline, with no residual symptoms.   REVIEW OF SYSTEMS: Full 14 system review of systems performed and notable only for those listed, all others are neg:  Constitutional: neg  Cardiovascular: neg Ear/Nose/Throat: neg  Skin: neg Eyes: neg Respiratory: neg Gastroitestinal: neg  Hematology/Lymphatic: neg  Endocrine: neg Musculoskeletal:neg Allergy/Immunology: neg Neurological: neg Psychiatric: neg Sleep : neg   ALLERGIES: No Known Allergies  HOME MEDICATIONS: Outpatient Prescriptions Prior to Visit  Medication Sig Dispense Refill  . amLODipine (NORVASC) 5 MG tablet Take 1 tablet (5 mg total) by mouth daily. 90 tablet 1  . atorvastatin (LIPITOR) 40 MG tablet Take 1 tablet (40 mg total) by mouth daily at 6 PM. 90 tablet 1  . aspirin EC 325 MG EC tablet Take 1 tablet (325 mg total) by mouth daily. (Patient not taking: Reported on 11/01/2014) 30 tablet 0   No facility-administered medications prior to visit.    PAST MEDICAL HISTORY: Past Medical History  Diagnosis Date  . Stroke     TIA 2/16  . Hypertension   . Dyslipidemia     PAST SURGICAL HISTORY: History reviewed. No pertinent past surgical history.  FAMILY HISTORY: Family History  Problem Relation Age of Onset  . Diabetes Mother   . Hypertension Mother   . Stroke Mother   . Heart disease Mother   . Hyperlipidemia Father   .  Hypertension Father   . Healthy Sister   . Healthy Brother   . Healthy Sister     SOCIAL HISTORY: Social History   Social History  . Marital Status: Married    Spouse Name: N/A  . Number of Children: 1  . Years of Education: 13   Occupational History  . [employer]    Social  History Main Topics  . Smoking status: Former Smoker -- 0.00 packs/day    Quit date: 04/07/2014  . Smokeless tobacco: Never Used  . Alcohol Use: 0.0 oz/week    0 Standard drinks or equivalent per week     Comment: occasional social drinker  . Drug Use: No  . Sexual Activity: Not on file   Other Topics Concern  . Not on file   Social History Narrative   Patient is right handed.   Patient drinks 1 cup of coffee per day.     PHYSICAL EXAM  Filed Vitals:   11/01/14 0750  BP: 145/98  Pulse: 73  Height: 6' (1.829 m)  Weight: 177 lb 3.2 oz (80.377 kg)   Body mass index is 24.03 kg/(m^2). General: The patient is alert and cooperative at the time of the examination. Neck: The neck is supple, no carotid bruits are noted. Respiratory: The respiratory examination is clear. Cardiovascular: The cardiovascular examination reveals a regular rate and rhythm, no obvious murmurs or rubs are noted. Skin: Extremities are without significant edema.  Neurologic Exam  Mental status: The patient is alert and oriented x 3 at the time of the examination. The patient has apparent normal recent and remote memory, with an apparently normal attention span and concentration ability.  Cranial nerves: Facial symmetry is present. There is good sensation of the face to pinprick and soft touch bilaterally. The strength of the facial muscles and the muscles to head turning and shoulder shrug are normal bilaterally. Speech is well enunciated, no aphasia or dysarthria is noted. Pupils are equal, round, and reactive to light. Discs are flat bilaterally.Extraocular movements are full. Visual fields are full. The tongue is midline, and the patient has symmetric elevation of the soft palate. No obvious hearing deficits are noted. Motor: The motor testing reveals 5 over 5 strength of all 4 extremities. Good symmetric motor tone is noted throughout. Sensory: Sensory testing is intact to pinprick, soft touch, vibration  sensation, and position sense on all 4 extremities. No evidence of extinction is noted. Coordination: Cerebellar testing reveals good finger-nose-finger and heel-to-shin bilaterally. Gait and station: Gait is normal. Tandem gait is normal. Romberg is negative. No drift is seen. Reflexes: Deep tendon reflexes are symmetric and normal bilaterally. Toes are downgoing bilaterally.  DIAGNOSTIC DATA (LABS, IMAGING, TESTING) - I reviewed patient records, labs, notes, testing and imaging myself where available.  Lab Results  Component Value Date   WBC 6.6 04/07/2014   HGB 15.3 04/07/2014   HCT 45.0 04/07/2014   MCV 91.7 04/07/2014   PLT 202 04/07/2014      Component Value Date/Time   NA 140 04/07/2014 1742   K 3.8 04/07/2014 1742   CL 103 04/07/2014 1742   CO2 24 04/07/2014 1736   GLUCOSE 102* 04/07/2014 1742   BUN 11 04/07/2014 1742   CREATININE 1.10 04/07/2014 1742   CALCIUM 8.9 04/07/2014 1736   PROT 6.7 04/07/2014 1736   ALBUMIN 3.8 04/07/2014 1736   AST 24 04/07/2014 1736   ALT 20 04/07/2014 1736   ALKPHOS 63 04/07/2014 1736   BILITOT 0.5 04/07/2014 1736  GFRNONAA 77* 04/07/2014 1736   GFRAA 90* 04/07/2014 1736   Lab Results  Component Value Date   CHOL 240* 04/08/2014   HDL 43 04/08/2014   LDLCALC 180* 04/08/2014   TRIG 86 04/08/2014   CHOLHDL 5.6 04/08/2014   Lab Results  Component Value Date   HGBA1C 4.8 04/08/2014    ASSESSMENT AND PLAN  49 y.o. year old male  has a past medical history of Stroke; Hypertension; and Dyslipidemia. and tobacco abuse here to follow up. The patient is a current patient of Dr. Anne Hahn who is out of the office today . This note is sent to the work in doctor.     Keep B/P less than 130/90 todays reading 145/98 Continue Lipitor for cholesterol Continue aspirin for secondary stroke prevention This is purchased OTC.  Continue follow-up with primary care Dr. Wynelle Link Congratulations on stopping smoking Lipids are followed by Dr. Wynelle Link  last  cholesterol 240,LDL 180 Carotid Doppler was negative for significant stenosis If recurrent stroke symptoms occur, call 911 and proceed to the hospital I spent additional 10 minutes in total face to face time with the patient more than 50% of which was spent counseling and coordination of care, reviewing test results reviewing medications and discussing and reviewing the diagnosis of stroke  and treatment options.  Discussed risk factor modification , risk of repeat stroke during the first year is 10-15%. Discussed importance of exercise, regular meals  and sleep.  Follow-up in 6 months. Vst time 25 min Nilda Riggs, [facility], [facility], APRN  [facility] [address] [place], [place] [address] [phone]  Personally  participated in and made any corrections needed to history, physical, neuro exam,assessment and plan as stated above.  Naomie Dean, MD [facility]

## 2015-05-02 ENCOUNTER — Ambulatory Visit: Payer: Self-pay | Admitting: Nurse Practitioner

## 2015-06-02 ENCOUNTER — Ambulatory Visit (INDEPENDENT_AMBULATORY_CARE_PROVIDER_SITE_OTHER): Payer: BLUE CROSS/BLUE SHIELD | Admitting: Nurse Practitioner

## 2015-06-02 ENCOUNTER — Encounter: Payer: Self-pay | Admitting: Nurse Practitioner

## 2015-06-02 VITALS — BP 135/91 | HR 70 | Ht 72.0 in | Wt 182.0 lb

## 2015-06-02 DIAGNOSIS — R030 Elevated blood-pressure reading, without diagnosis of hypertension: Secondary | ICD-10-CM | POA: Diagnosis not present

## 2015-06-02 DIAGNOSIS — E785 Hyperlipidemia, unspecified: Secondary | ICD-10-CM

## 2015-06-02 DIAGNOSIS — IMO0001 Reserved for inherently not codable concepts without codable children: Secondary | ICD-10-CM

## 2015-06-02 DIAGNOSIS — I6300 Cerebral infarction due to thrombosis of unspecified precerebral artery: Secondary | ICD-10-CM | POA: Diagnosis not present

## 2015-06-02 NOTE — Progress Notes (Signed)
[facility]  PATIENT: Charles Harding DOB: 07/17/1965   REASON FOR VISIT: Follow-up for stroke, hyperlipidemia and hypertension HISTORY FROM: Patient    HISTORY OF PRESENT ILLNESS:Mr. Charles Harding, 50 year old black male returns for follow-up. He was last seen in the office 10/27/2014. He had a stroke and was admitted to the hospital 04/07/2014 with left-sided tingling left facial droop. Carotid Doppler unremarkable, 2-D echo did not show cardioembolic source. MRI of the brain showed right centrum semiovale lesion. He was placed on aspirin, Lipitor and Norvasc. He has not had further stroke or TIA symptoms. He has stopped smoking. He is back to work,  and back  to his usual activities.  He is walking for exercise.He returns for reevaluation   HISTORY: KW Mr. Charles Harding is a 50 year old right-handed black male with a history of untreated hypertension and dyslipidemia. The patient was admitted to the hospital on 04/07/14 with left-sided tingling, left facial droop, and the hospital records were reviewed online. The patient appeared to improve with his deficits rapidly, and for that reason TPA was not given initially. However, following admission, the patient seemed to worsen overnight, with more prominent left-sided features of numbness and weakness. The patient was given TPA at a time when his NIH stroke scale score was 10. The patient has undergone MRI evaluation and MRA of the head. MRA was unremarkable, MRI the brain shows a mildly diffusion weighted positive lesion in the right centrum semiovale. The patient underwent carotid Doppler studies that were unremarkable, and a 2-D echocardiogram that did not show evidence of a cardioembolic source of stroke. The patient has been placed on aspirin, and a cholesterol lowering agent, and he was placed on medications for blood pressure. On admission, his blood pressure was 172/111. The patient has no primary care physician. He comes to this  office for an evaluation. He was smoking 2 cigars a day, he has stopped doing this. Currently, the patient believes that he is back to his usual baseline, with no residual symptoms.    REVIEW OF SYSTEMS: Full 14 system review of systems performed and notable only for those listed, all others are neg:  Constitutional: neg  Cardiovascular: neg Ear/Nose/Throat: neg  Skin: neg Eyes: neg Respiratory: neg Gastroitestinal: neg  Hematology/Lymphatic: neg  Endocrine: neg Musculoskeletal:neg Allergy/Immunology: neg Neurological: neg Psychiatric: neg Sleep : neg   ALLERGIES: No Known Allergies  HOME MEDICATIONS: Outpatient Prescriptions Prior to Visit  Medication Sig Dispense Refill  . amLODipine (NORVASC) 5 MG tablet Take 1 tablet (5 mg total) by mouth daily. 90 tablet 1  . aspirin EC 325 MG EC tablet Take 1 tablet (325 mg total) by mouth daily. 30 tablet 0  . atorvastatin (LIPITOR) 40 MG tablet Take 1 tablet (40 mg total) by mouth daily at 6 PM. 90 tablet 1   No facility-administered medications prior to visit.    PAST MEDICAL HISTORY: Past Medical History  Diagnosis Date  . Stroke [facility](HCC)     TIA 2/16  . Hypertension   . Dyslipidemia     PAST SURGICAL HISTORY: History reviewed. No pertinent past surgical history.  FAMILY HISTORY: Family History  Problem Relation Age of Onset  . Diabetes Mother   . Hypertension Mother   . Stroke Mother   . Heart disease Mother   . Hyperlipidemia Father   . Hypertension Father   . Healthy Sister   . Healthy Brother   . Healthy Sister     SOCIAL HISTORY: Social History   Social History  .  Marital Status: Married    Spouse Name: N/A  . Number of Children: 1  . Years of Education: 13   Occupational History  . [employer]    Social History Main Topics  . Smoking status: Former Smoker -- 0.00 packs/day    Quit date: 04/07/2014  . Smokeless tobacco: Never Used  . Alcohol Use: 0.0 oz/week    0 Standard drinks or equivalent per  week     Comment: occasional social drinker  . Drug Use: No  . Sexual Activity: Not on file   Other Topics Concern  . Not on file   Social History Narrative   Patient is right handed.   Patient drinks 1 cup of coffee per day.     PHYSICAL EXAM  Filed Vitals:   06/02/15 1006 06/02/15 1009  BP: 142/102 135/91  Pulse: 70 70  Height: 6' (1.829 m)   Weight: 182 lb (82.555 kg)    Body mass index is 24.68 kg/(m^2). General: The patient is alert and cooperative at the time of the examination. Neck: The neck is supple, no carotid bruits are noted. Respiratory: The respiratory examination is clear. Cardiovascular: The cardiovascular examination reveals a regular rate and rhythm, no obvious murmurs or rubs are noted. Skin: Extremities are without significant edema.  Neurologic Exam  Mental status: The patient is alert and oriented x 3 at the time of the examination. The patient has apparent normal recent and remote memory, with an apparently normal attention span and concentration ability.  Cranial nerves: Facial symmetry is present. There is good sensation of the face to pinprick and soft touch bilaterally. The strength of the facial muscles and the muscles to head turning and shoulder shrug are normal bilaterally. Speech is well enunciated, no aphasia or dysarthria is noted. Pupils are equal, round, and reactive to light. Discs are flat bilaterally.Extraocular movements are full. Visual fields are full. The tongue is midline, and the patient has symmetric elevation of the soft palate. No obvious hearing deficits are noted. Motor: The motor testing reveals 5 over 5 strength of all 4 extremities. Good symmetric motor tone is noted throughout. Sensory: Sensory testing is intact to pinprick, soft touch, vibration sensation, and position sense on all 4 extremities. No evidence of extinction is noted. Coordination: Cerebellar testing reveals good finger-nose-finger and heel-to-shin  bilaterally. Gait and station: Gait is normal. Tandem gait is normal. Romberg is negative. No drift is seen. Reflexes: Deep tendon reflexes are symmetric and normal bilaterally. Toes are downgoing bilaterally   DIAGNOSTIC DATA (LABS, IMAGING, TESTING) -  ASSESSMENT AND PLAN 50 y.o. year old male  has a past medical history of Stroke; Hypertension; and Dyslipidemia here to follow up.   Keep B/P less than 130/90 todays reading 135/91 continue Norvasc Continue Lipitor for cholesterol labs to be followed by primary care Dr. Wynelle Link Continue aspirin for secondary stroke prevention  Continue follow-up with primary care Dr. Wynelle Link Congratulations on stopping smoking and continued not smoking If recurrent  stroke symptoms occur, call 911 and proceed to the hospital Discussed risk factor modification ,  Discussed importance of exercise, regular meals and sleep. Discharge from neurologic services Nilda Riggs, [employer] [facility], [facility], APRN  [facility] [address] [place], [place] [address] [phone]

## 2015-06-02 NOTE — Patient Instructions (Signed)
Keep B/P less than 130/90 todays reading 135/91 continue Norvasc Continue Lipitor for cholesterol labs to be followed by primary care Dr. Wynelle LinkSun Continue aspirin for secondary stroke prevention  Continue follow-up with primary care Dr. Wynelle LinkSun Congratulations on stopping smoking and continued not smoking If recurrent  stroke symptoms occur, call 911 and proceed to the hospital Discussed risk factor modification ,  Discussed importance of exercise, regular meals and sleep. Discharge from neurologic services

## 2016-02-15 IMAGING — MR MR HEAD W/O CM
8 of 10 series · 35 of 48 positions shown · non-contrast
Comparison: CT of the head April 09, 2014 at [DATE] a.m. and MRI
of the brain April 07, 2014

CLINICAL DATA: Recurrent LEFT-sided weakness and facial droop at
and subsequently resolved again.

EXAM:
MRI HEAD WITHOUT CONTRAST
TECHNIQUE: Multiplanar, multiecho pulse sequences of the brain and surrounding
structures were obtained without intravenous contrast.

[Series 3: DWI · axial · 3.0mm · 1.09mm/px · z∈[-71,+60]mm · 8 of 90 slices shown (1 of 4)]
[im 1/90]
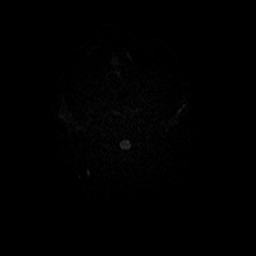
[im 13/90]
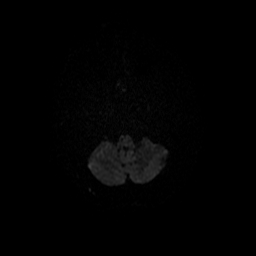
[im 26/90]
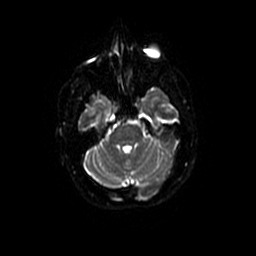
[im 39/90]
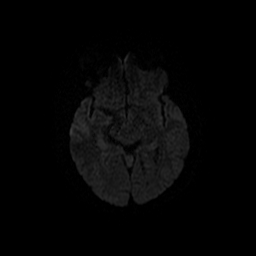
[im 51/90]
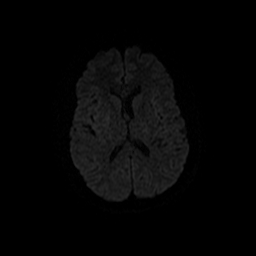
[im 64/90]
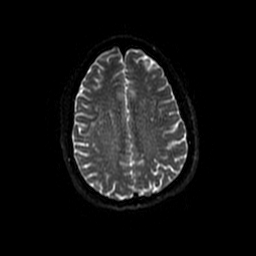
[im 77/90]
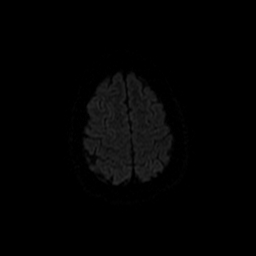
[im 90/90]
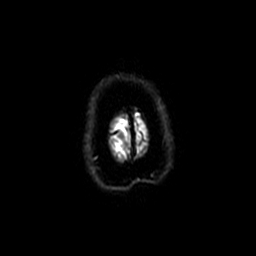

[Series 4: T1 · sagittal · 5.0mm · 0.47mm/px · 3 of 25 slices shown]
[im 1/25]
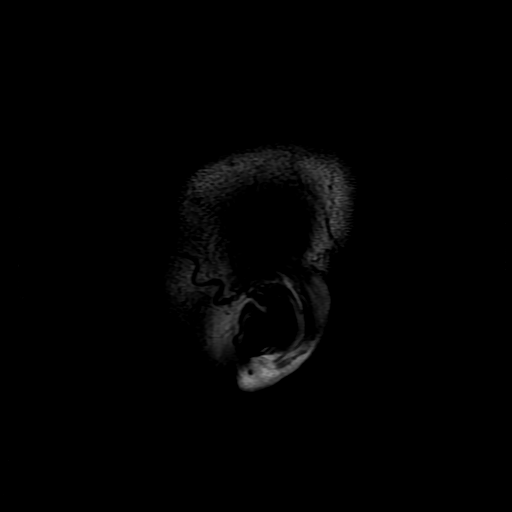
[im 13/25]
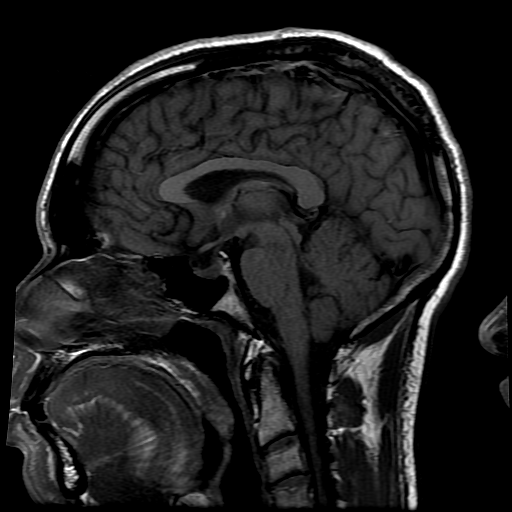
[im 25/25]
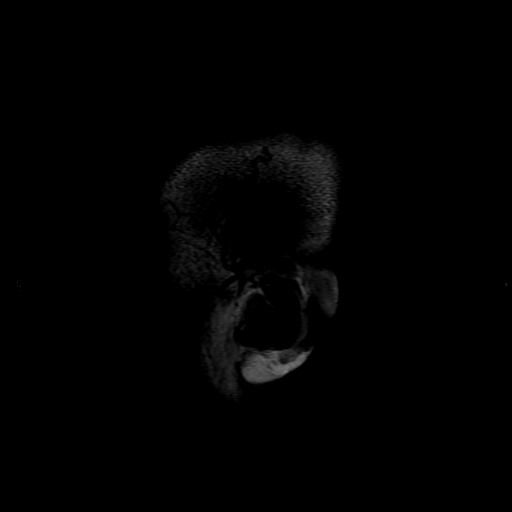

[Series 5: DWI · coronal · 5.0mm · 1.09mm/px · 7 of 66 slices shown (2 of 4)]
[im 1/66]
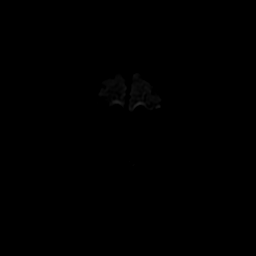
[im 11/66]
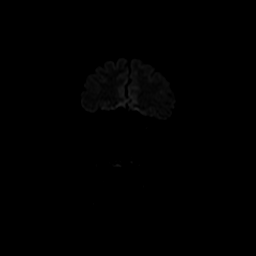
[im 22/66]
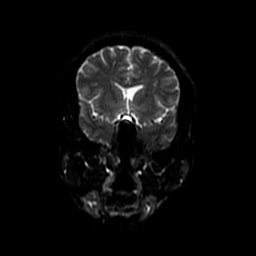
[im 33/66]
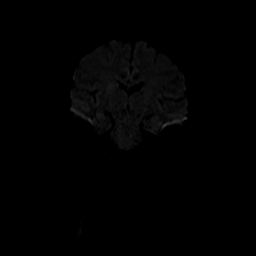
[im 44/66]
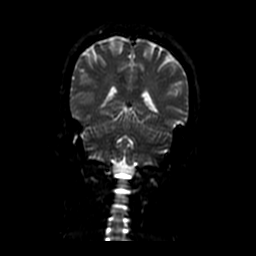
[im 55/66]
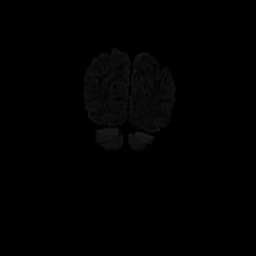
[im 66/66]
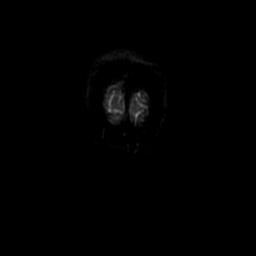

[Series 6: T2 · axial · 5.0mm · 0.43mm/px · z∈[-65,+78]mm · 3 of 25 slices shown (1 of 2)]
[im 1/25]
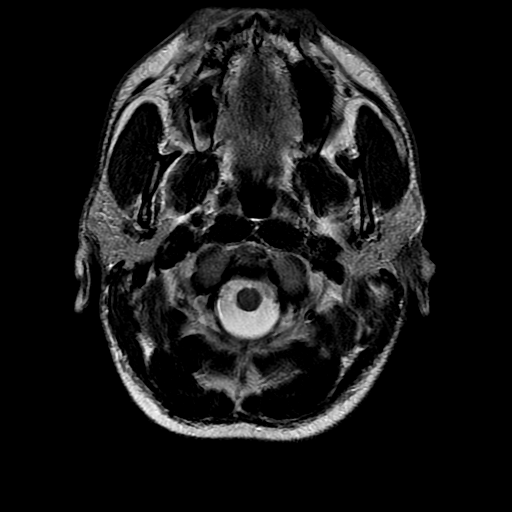
[im 13/25]
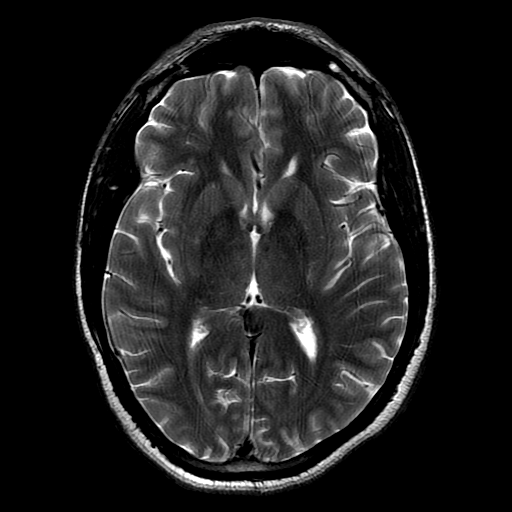
[im 25/25]
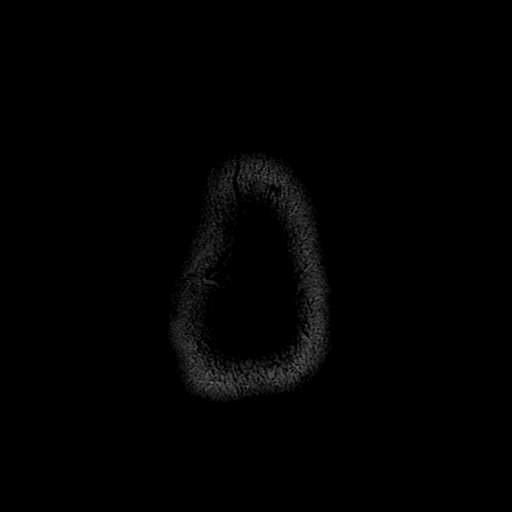

[Series 7: FLAIR · axial · 5.0mm · 0.43mm/px · z∈[-65,+78]mm · 3 of 25 slices shown]
[im 1/25]
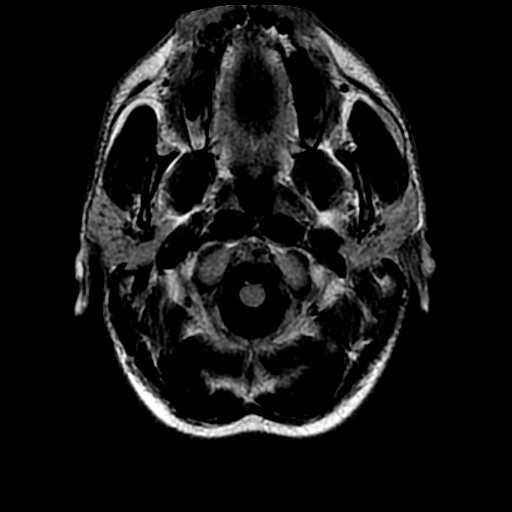
[im 13/25]
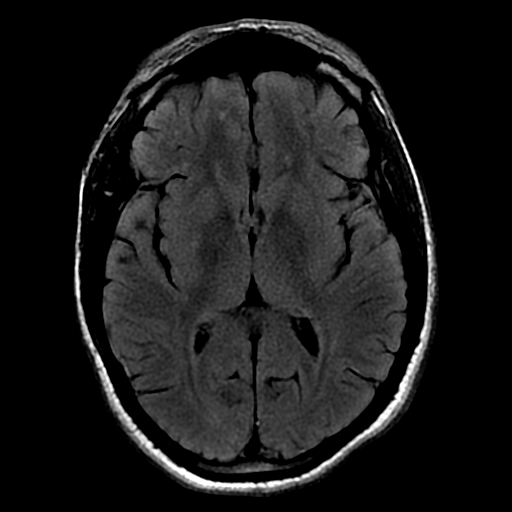
[im 25/25]
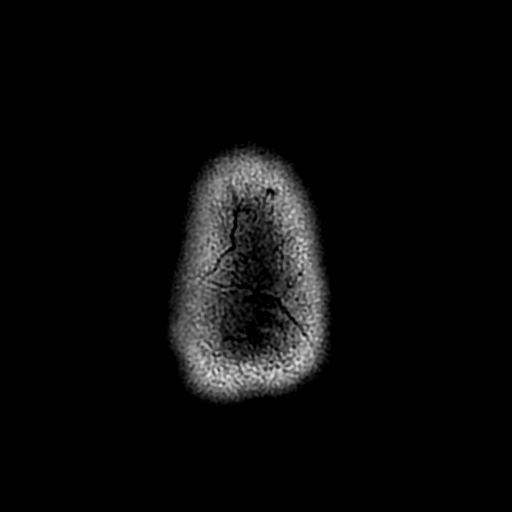

[Series 10: T2 · coronal · 5.0mm · 0.43mm/px · 3 of 32 slices shown (2 of 2)]
[im 1/32]
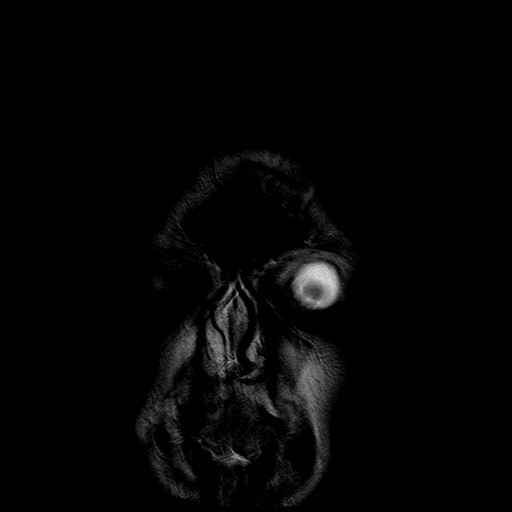
[im 16/32]
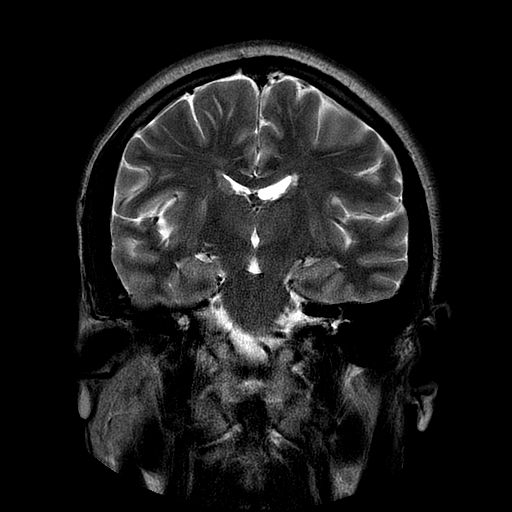
[im 32/32]
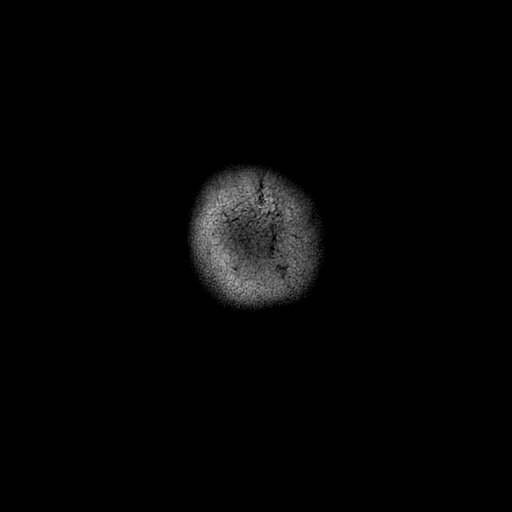

[Series 300: DWI · axial · 3.0mm · 1.09mm/px · z∈[-71,+60]mm · 5 of 45 slices shown (3 of 4)]
[im 1/45]
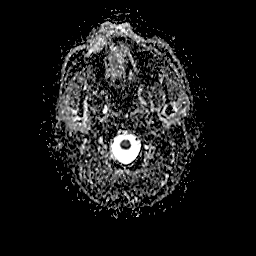
[im 12/45]
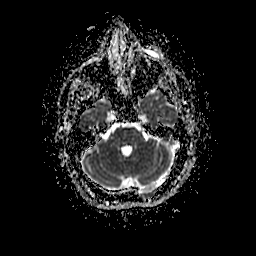
[im 23/45]
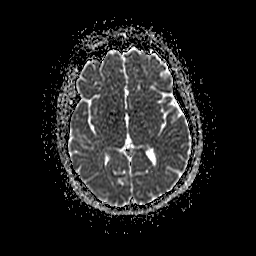
[im 34/45]
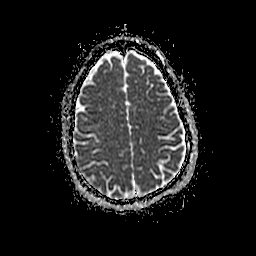
[im 45/45]
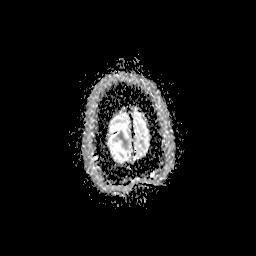

[Series 500: DWI · coronal · 5.0mm · 1.09mm/px · 3 of 33 slices shown (4 of 4)]
[im 1/33]
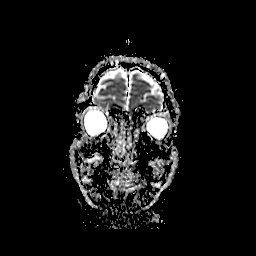
[im 17/33]
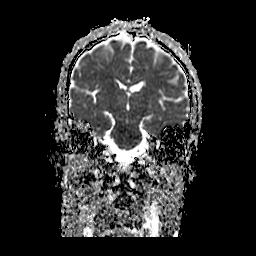
[im 33/33]
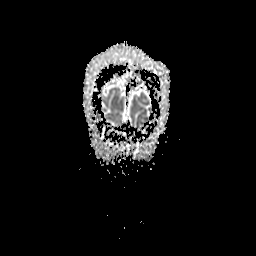

[35 of 48 positions shown; findings below may reference images not displayed]

FINDINGS: Mild motion degraded examination.

The ventricles and sulci are normal for patient's age. No abnormal
parenchymal signal, mass lesions, mass effect. Faint reduced
diffusion within RIGHT caudate body. Faint suspected low ADC values,
faint T2 hyperintense signal. No susceptibility artifact to suggest
hemorrhage. Similar patchy supratentorial white matter T2
hyperintensities, advanced for age.

No abnormal extra-axial fluid collections. No extra-axial masses
though, contrast enhanced sequences would be more sensitive. Normal
major intracranial vascular flow voids seen at the skull base.

Ocular globes and orbital contents are unremarkable though not
tailored for evaluation. No abnormal sellar expansion. Visualized
paranasal sinuses and mastoid air cells are well-aerated. No
suspicious calvarial bone marrow signal. No abnormal sellar
expansion. Craniocervical junction maintained.
IMPRESSION: Faint reduced diffusion RIGHT caudate body concerning for acute
ischemia, less likely ex hyperacute demyelination.

Advanced white matter changes for age, nonspecific and may reflect
sequelae of chronic hypertension, migraine ; atypical distribution
for demyelination.

  By: Pmaster Mahere

## 2016-02-16 IMAGING — CT CT HEAD W/O CM
2 series · 16 of 30 positions shown, 20 images · non-contrast
Comparison: 04/09/2014

CLINICAL DATA: Recent CVA with tPA administration

EXAM:
CT HEAD WITHOUT CONTRAST
TECHNIQUE: Contiguous axial images were obtained from the base of the skull
through the vertex without intravenous contrast.

[Series 201: head w/o, idose (1) · axial · non-contrast · 0.46mm/px · z∈[+94,+224]mm · 13 of 32 slices shown, 17 images]
[im 3/32  brain]
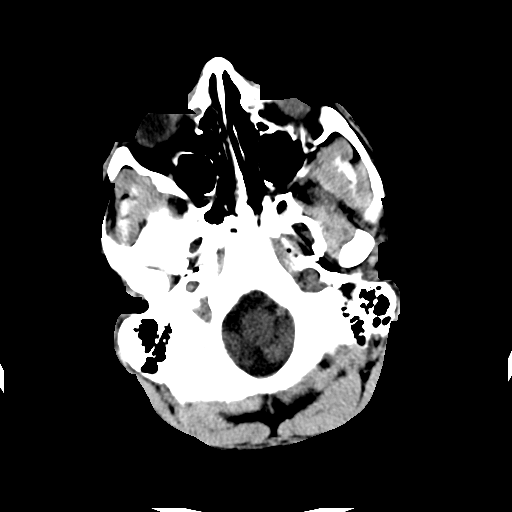
[im 3/32  bone]
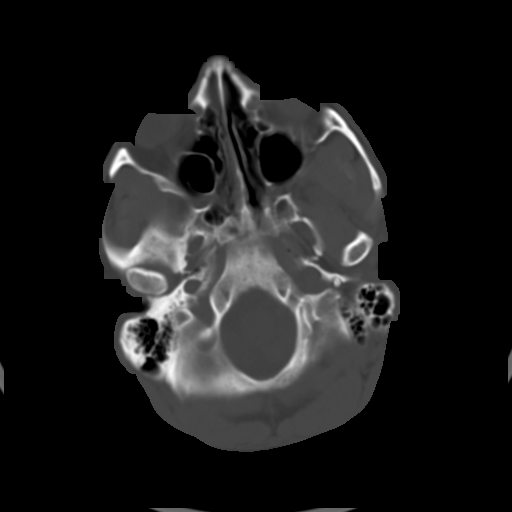
[im 5/32  brain]
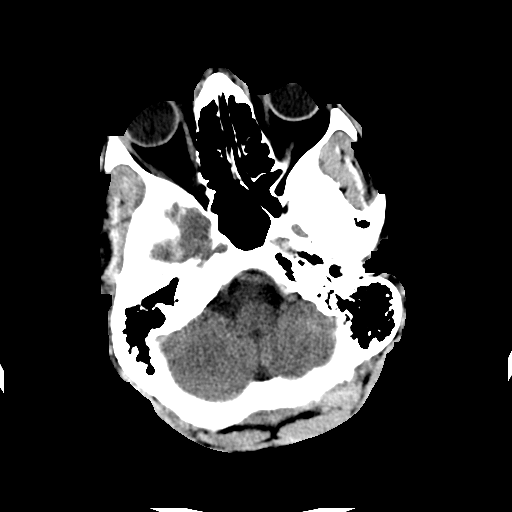
[im 7/32  brain]
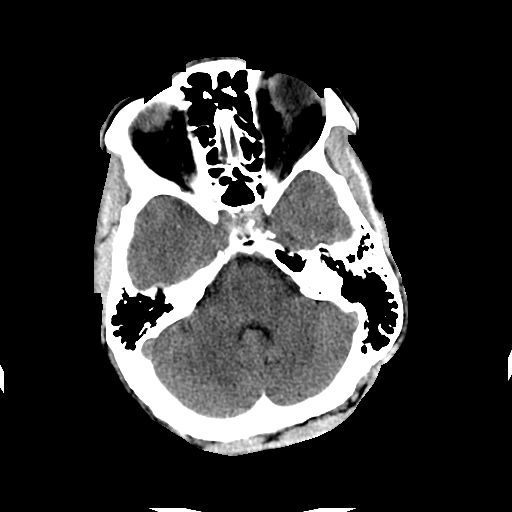
[im 9/32  brain]
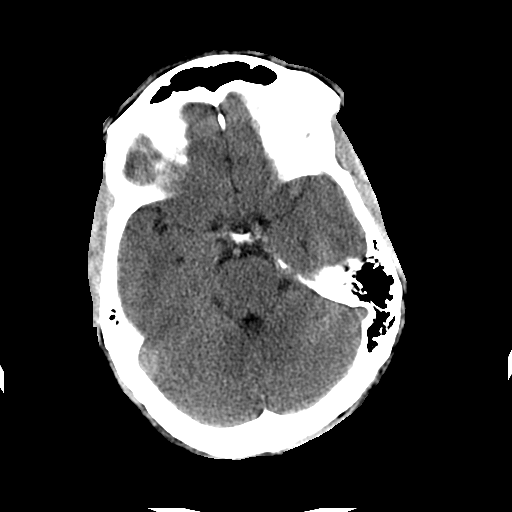
[im 12/32  brain]
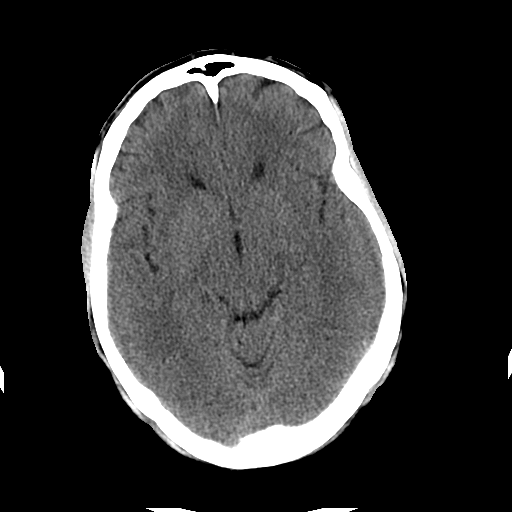
[im 12/32  bone]
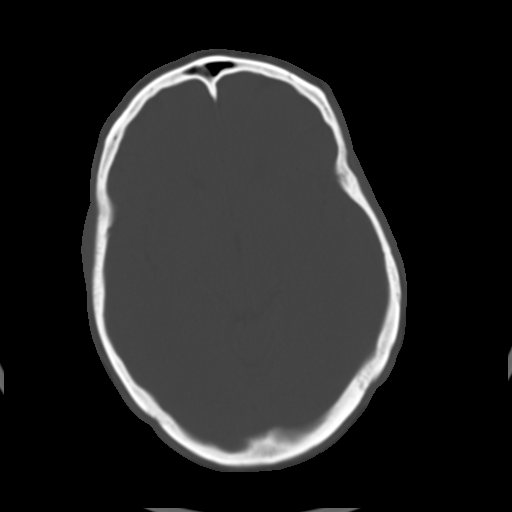
[im 14/32  brain]
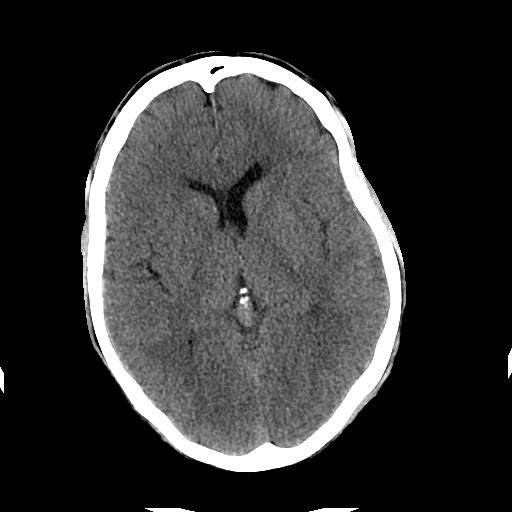
[im 16/32  brain]
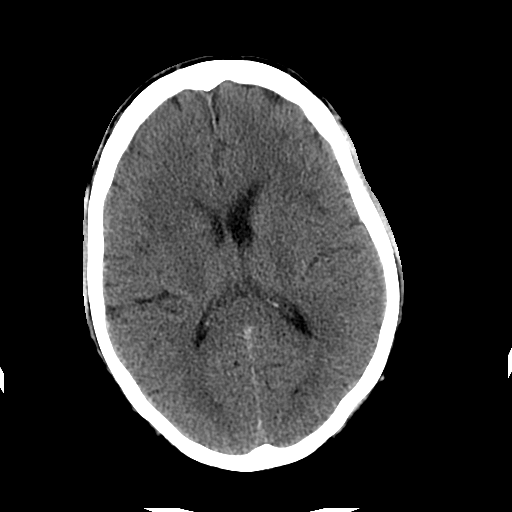
[im 18/32  brain]
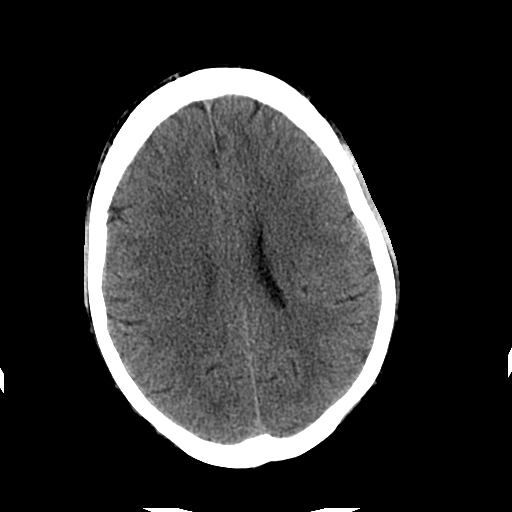
[im 20/32  brain]
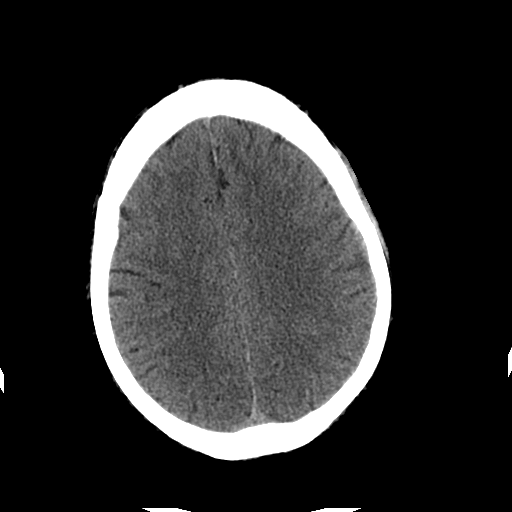
[im 20/32  bone]
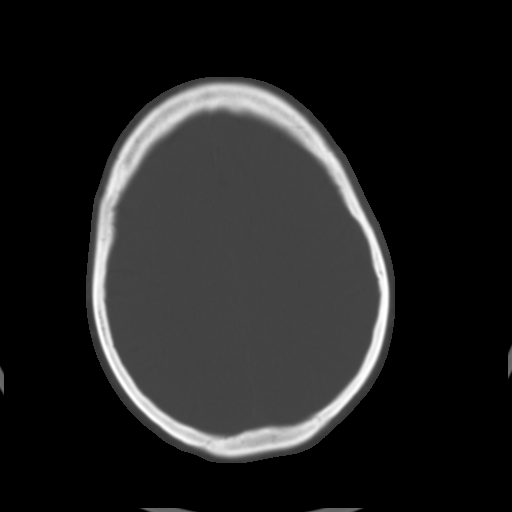
[im 23/32  brain]
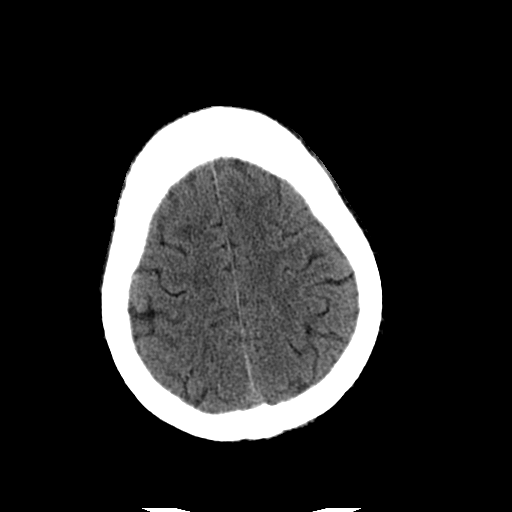
[im 25/32  brain]
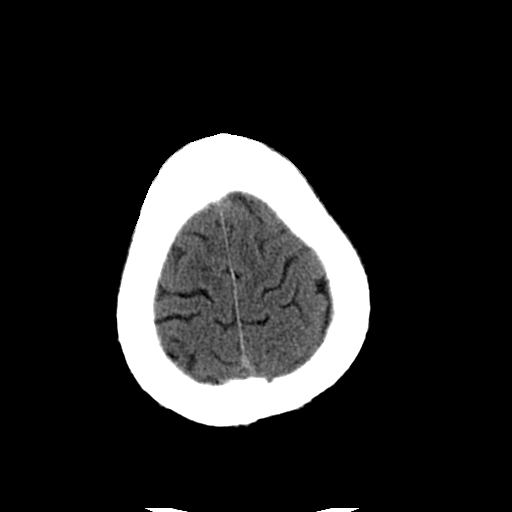
[im 27/32  brain]
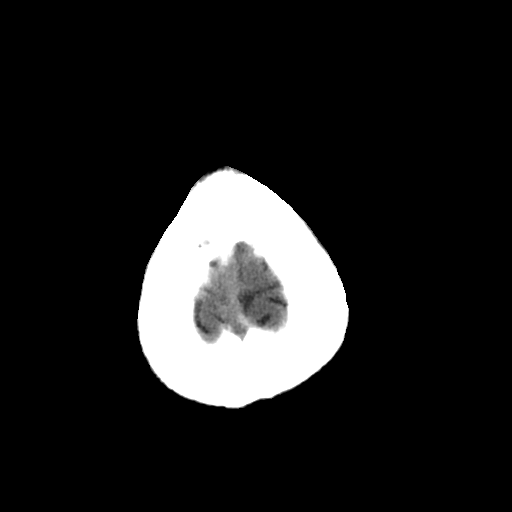
[im 29/32  brain]
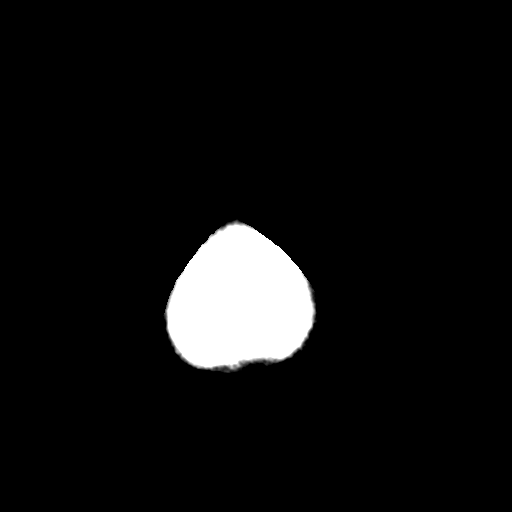
[im 29/32  bone]
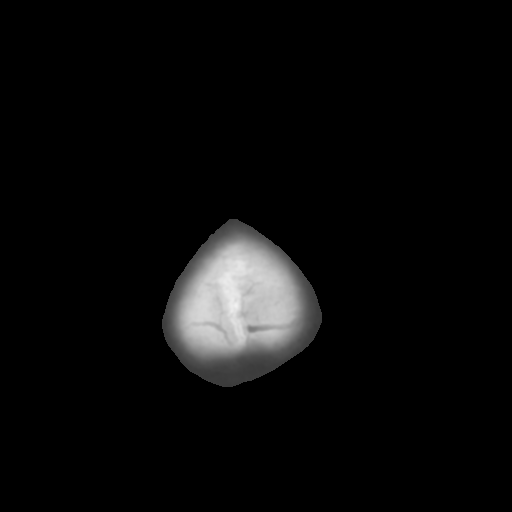

[Series 202: head w/o bone, idose (1) · axial · non-contrast · 0.46mm/px · z∈[+94,+139]mm · 3 of 32 slices shown]
[im 3/32  bone]
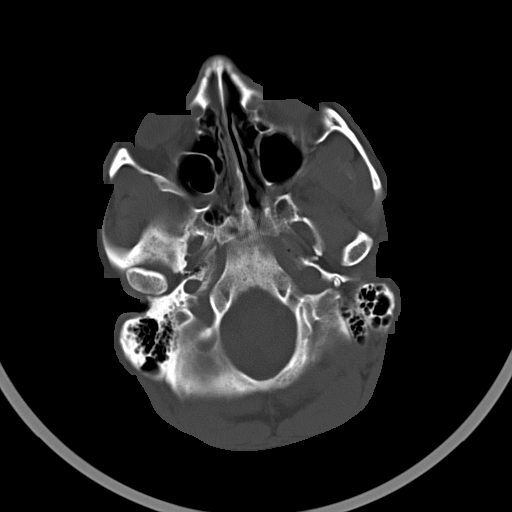
[im 7/32  bone]
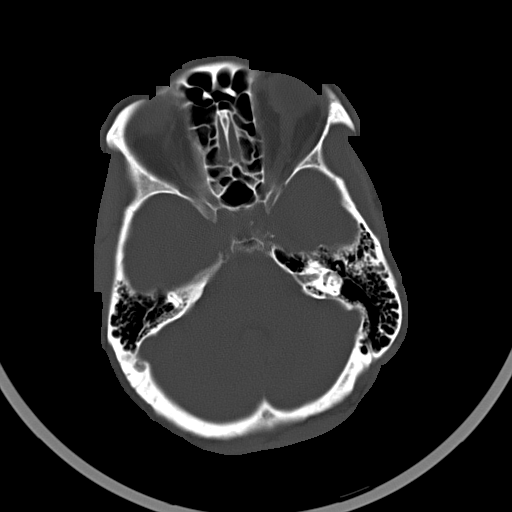
[im 12/32  bone]
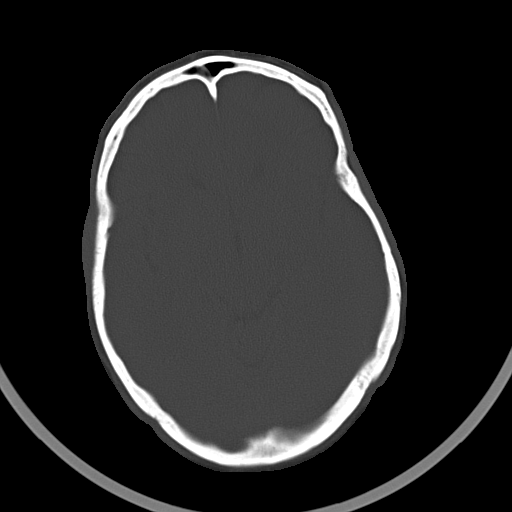

[16 of 30 positions shown; findings below may reference images not displayed]

FINDINGS: Bony calvarium is intact. No findings to suggest acute hemorrhage,
acute infarction or space-occupying mass lesion are noted. No
significant interval change from the prior exam is seen.
IMPRESSION: No acute abnormality noted. No significant change from the prior
study.

## 2016-09-02 ENCOUNTER — Encounter (HOSPITAL_COMMUNITY): Payer: Self-pay

## 2016-09-02 ENCOUNTER — Ambulatory Visit (HOSPITAL_COMMUNITY)
Admission: EM | Admit: 2016-09-02 | Discharge: 2016-09-02 | Disposition: A | Discharge status: Home / Self Care | Payer: BLUE CROSS/BLUE SHIELD | Attending: Physician Assistant | Admitting: Physician Assistant

## 2016-09-02 DIAGNOSIS — H1032 Unspecified acute conjunctivitis, left eye: Secondary | ICD-10-CM | POA: Diagnosis not present

## 2016-09-02 DIAGNOSIS — R030 Elevated blood-pressure reading, without diagnosis of hypertension: Secondary | ICD-10-CM

## 2016-09-02 DIAGNOSIS — I10 Essential (primary) hypertension: Secondary | ICD-10-CM | POA: Diagnosis not present

## 2016-09-02 DIAGNOSIS — Z8673 Personal history of transient ischemic attack (TIA), and cerebral infarction without residual deficits: Secondary | ICD-10-CM | POA: Diagnosis not present

## 2016-09-02 MED ORDER — DOXYCYCLINE HYCLATE 100 MG PO CAPS: 100.0000 mg | ORAL_CAPSULE | Freq: Two times a day (BID) | ORAL | 0 refills | Status: AC

## 2016-09-02 MED ORDER — POLYMYXIN B-TRIMETHOPRIM 10000-0.1 UNIT/ML-% OP SOLN: 2.0000 [drp] | Freq: Four times a day (QID) | OPHTHALMIC | 0 refills | Status: AC

## 2016-09-02 NOTE — ED Provider Notes (Signed)
09/02/2016 1:15 PM   DOB: 05/31/1965 / MRN: [id number]  SUBJECTIVE:  Charles Harding is a 51 y.o. male presenting for chief complaint of left eye irritation that started yesterday.  Thinks that he may have been exposed to pink eye in the last few days as he has had a similar reaction in the past. Is a truck driver.   Tells me that his vision has not changed. He is not getting worse.  No fever.    He is taking his norvasc at this time and does not miss doses.  Denies HA, temporal pain, SOB, chest pain, leg swelling today.   He has No Known Allergies.   He  has a past medical history of Dyslipidemia; Hypertension; and Stroke [facility]).    He  reports that he quit smoking about 2 years ago. He smoked 0.00 packs per day. He has never used smokeless tobacco. He reports that he drinks alcohol. He reports that he does not use drugs. He  has no sexual activity history on file. The patient  has no past surgical history on file.  His family history includes Diabetes in his mother; Healthy in his brother, sister, and sister; Heart disease in his mother; Hyperlipidemia in his father; Hypertension in his father and mother; Stroke in his mother.  Review of Systems  Constitutional: Negative for chills, diaphoresis and fever.  Eyes: Negative.   Respiratory: Negative for shortness of breath.   Cardiovascular: Negative for chest pain, orthopnea and leg swelling.  Gastrointestinal: Negative for nausea.  Skin: Negative for rash.  Neurological: Negative for dizziness, sensory change, speech change, focal weakness and headaches.    OBJECTIVE:  BP (!) 159/110 (BP Location: Right Arm)   Pulse 80   Temp 98.1 F (36.7 C) (Oral)   Resp 17   SpO2 97%   Wt Readings from Last 3 Encounters:  06/02/15 182 lb (82.6 kg)  11/01/14 177 lb 3.2 oz (80.4 kg)  05/10/14 172 lb (78 kg)   Physical Exam  Constitutional: He appears well-developed. He is active and cooperative.  Non-toxic appearance.  HENT:  Right Ear: Hearing,  tympanic membrane, external ear and ear canal normal.  Left Ear: Hearing, tympanic membrane, external ear and ear canal normal.  Nose: Nose normal. Right sinus exhibits no maxillary sinus tenderness and no frontal sinus tenderness. Left sinus exhibits no maxillary sinus tenderness and no frontal sinus tenderness.  Mouth/Throat: Uvula is midline, oropharynx is clear and moist and mucous membranes are normal. No oropharyngeal exudate, posterior oropharyngeal edema or tonsillar abscesses.  Eyes: Pupils are equal, round, and reactive to light. Left eye exhibits discharge and exudate. Left conjunctiva is injected. Left conjunctiva has no hemorrhage. Left eye exhibits normal extraocular motion and no nystagmus. Left pupil is round and reactive. Pupils are equal.  Cardiovascular: Normal rate.   Pulmonary/Chest: Effort normal. No tachypnea.  Lymphadenopathy:       Head (right side): No submandibular and no tonsillar adenopathy present.       Head (left side): No submandibular and no tonsillar adenopathy present.    He has no cervical adenopathy.  Neurological: He is alert.  Skin: Skin is warm and dry. He is not diaphoretic. No pallor.  Vitals reviewed.   Lab Results  Component Value Date   HGBA1C 4.8 04/08/2014   No results found for this or any previous visit (from the past 72 hour(s)).  No results found.  ASSESSMENT AND PLAN:  Acute conjunctivitis of left eye, unspecified acute conjunctivitis type -  Topical for now and if worsening he can start doxy by mouth.  Elevated blood pressure reading - He will double his norvasc until seeing his primary.  History of stroke    The patient is advised to call or return to clinic if he does not see an improvement in symptoms, or to seek the care of the closest emergency department if he worsens with the above plan.   [name], MHS, PA-C 09/02/2016 1:15 PM    [name], PA-C 09/02/16 1316

## 2016-09-02 NOTE — Discharge Instructions (Addendum)
Please double you Norvasc until you are able to get back to you primary care physician.

## 2016-09-02 NOTE — ED Triage Notes (Signed)
[facility] with bilateral swelling of eyes x2 days, pt denies any injuries but states he has had poison Ivy and not sure if it has affected his eyes

## 2018-12-01 ENCOUNTER — Other Ambulatory Visit: Payer: Self-pay

## 2018-12-01 DIAGNOSIS — Z20822 Contact with and (suspected) exposure to covid-19: Secondary | ICD-10-CM

## 2018-12-03 LAB — NOVEL CORONAVIRUS, NAA: SARS-CoV-2, NAA: NOT DETECTED

## 2021-02-08 ENCOUNTER — Encounter (HOSPITAL_COMMUNITY): Payer: Self-pay

## 2021-02-08 ENCOUNTER — Ambulatory Visit (HOSPITAL_COMMUNITY)
Admission: EM | Admit: 2021-02-08 | Discharge: 2021-02-08 | Disposition: A | Discharge status: Home / Self Care | Payer: Self-pay | Attending: Internal Medicine | Admitting: Internal Medicine

## 2021-02-08 ENCOUNTER — Ambulatory Visit (INDEPENDENT_AMBULATORY_CARE_PROVIDER_SITE_OTHER): Payer: Self-pay

## 2021-02-08 DIAGNOSIS — R6883 Chills (without fever): Secondary | ICD-10-CM

## 2021-02-08 DIAGNOSIS — J189 Pneumonia, unspecified organism: Secondary | ICD-10-CM

## 2021-02-08 DIAGNOSIS — R059 Cough, unspecified: Secondary | ICD-10-CM

## 2021-02-08 LAB — POC INFLUENZA A AND B ANTIGEN (URGENT CARE ONLY)
INFLUENZA A ANTIGEN, POC: NEGATIVE
INFLUENZA B ANTIGEN, POC: NEGATIVE

## 2021-02-08 MED ORDER — ACETAMINOPHEN 325 MG PO TABS
975.0000 mg | ORAL_TABLET | Freq: Once | ORAL | Status: AC
  Administered 2021-02-08: 19:00:00 975 mg via ORAL

## 2021-02-08 MED ORDER — AMOXICILLIN-POT CLAVULANATE 875-125 MG PO TABS: 1.0000 | ORAL_TABLET | Freq: Two times a day (BID) | ORAL | 0 refills | Status: DC

## 2021-02-08 MED ORDER — BENZONATATE 200 MG PO CAPS: 200.0000 mg | ORAL_CAPSULE | Freq: Three times a day (TID) | ORAL | 0 refills | Status: DC | PRN

## 2021-02-08 MED ORDER — ACETAMINOPHEN 325 MG PO TABS
ORAL_TABLET | ORAL | Status: AC
  Filled 2021-02-08: qty 1

## 2021-02-08 MED ORDER — ACETAMINOPHEN 325 MG PO TABS
ORAL_TABLET | ORAL | Status: AC
  Filled 2021-02-08: qty 3

## 2021-02-08 NOTE — Discharge Instructions (Addendum)
Your flu test is negative The chest xray shows pneumonia in your right lower lung, please make sure to have a repeated chest xray in 6-8 weeks with your primary care provider to make sure this has resolved.

## 2021-02-08 NOTE — ED Triage Notes (Signed)
Pt presents with non productive cough and chills since waking up this morning; pt states he has taken a dose of theraflu

## 2021-02-08 NOTE — ED Provider Notes (Addendum)
[facility]    CSN: [id number] Arrival date & time: 02/08/21  1736      History   Chief Complaint Chief Complaint  Patient presents with   Cough   Chills    HPI Charles Harding is a 55 y.o. male who presents with onset of rhinitis, non productive cough and chills since he woke up this am. Took theraflu this am. Since then his is getting worse and now getting mild body aches and HA. His appetite is normal, and denies fatigue. Had covid last year, and has had all his covid shots. He no longer smokes.     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 No pertinent surgical history.     Home Medications    Prior to Admission medications   Medication Sig Start Date End Date Taking? Authorizing Provider  amoxicillin-clavulanate (AUGMENTIN) 875-125 MG tablet Take 1 tablet by mouth every 12 (twelve) hours. 02/08/21  Yes Rodriguez-Southworth, Nettie Elm, PA-C  benzonatate (TESSALON) 200 MG capsule Take 1 capsule (200 mg total) by mouth 3 (three) times daily as needed for cough. 02/08/21  Yes Rodriguez-Southworth, Nettie Elm, PA-C  amLODipine (NORVASC) 5 MG tablet Take 1 tablet (5 mg total) by mouth daily. 05/10/14   York Spaniel, MD  aspirin EC 325 MG EC tablet Take 1 tablet (325 mg total) by mouth daily. 04/11/14   Rinehuls, Kinnie Scales, PA-C  atorvastatin (LIPITOR) 40 MG tablet Take 1 tablet (40 mg total) by mouth daily at 6 PM. 05/10/14   York Spaniel, MD  trimethoprim-polymyxin b (POLYTRIM) ophthalmic solution Place 2 drops into the left eye every 6 (six) hours. 09/02/16   [name], PA-C    Family History Family History  Problem Relation Age of Onset   Diabetes Mother    Hypertension  Mother    Stroke Mother    Heart disease Mother    Hyperlipidemia Father    Hypertension Father    Healthy Sister    Healthy Brother    Healthy Sister     Social History Social History   Tobacco Use   Smoking status: Former    Packs/day: 0.00    Types: Cigarettes    Quit date: 04/07/2014    Years since quitting: 6.8   Smokeless tobacco: Never  Substance Use Topics   Alcohol use: Yes    Alcohol/week: 0.0 standard drinks    Comment: occasional social drinker   Drug use: No     Allergies   Patient has no known allergies.   Review of Systems Review of Systems  Constitutional:  Negative for appetite change, chills, diaphoresis, fatigue and fever.  HENT:  Positive for congestion, postnasal drip and rhinorrhea. Negative for ear discharge, ear pain, sore throat and trouble swallowing.   Eyes:  Negative for discharge.  Respiratory:  Positive for cough. Negative for chest tightness and shortness of breath.   Cardiovascular:  Negative for chest pain.  Gastrointestinal:        Denies GERD  Musculoskeletal:  Positive for myalgias. Negative for gait problem.  Skin:  Negative for rash.  Neurological:  Positive for headaches.  Hematological:  Negative for adenopathy.  Physical Exam Triage Vital Signs ED Triage Vitals  Enc Vitals Group     BP 02/08/21 1858 (!) 174/106     Pulse Rate 02/08/21 1858 (!) 110     Resp 02/08/21 1858 20     Temp 02/08/21 1858 99.2 F (37.3 C)     Temp Source 02/08/21 1858 Oral     SpO2 02/08/21 1858 95 %     Weight --      Height --      Head Circumference --      Peak Flow --      Pain Score 02/08/21 1857 0     Pain Loc --      Pain Edu? --      Excl. in GC? --    No data found.  Updated Vital Signs BP (!) 174/106 (BP Location: Right Arm)    Pulse (!) 110    Temp 99.2 F (37.3 C) (Oral)    Resp 20    SpO2 95%   Visual Acuity Right Eye Distance:   Left Eye Distance:   Bilateral Distance:    Right Eye Near:   Left Eye Near:     Bilateral Near:       Physical Exam Vitals signs and nursing note reviewed.  Constitutional:      General: he is not in acute distress.    Appearance: Normal appearance. He is not ill-appearing, toxic-appearing or diaphoretic.  HENT:     Head: Normocephalic.     Right Ear: Tympanic membrane, ear canal and external ear normal.     Left Ear: Tympanic membrane, ear canal and external ear normal.     Nose: with clear rhinitis    Mouth/Throat:     Mouth: Mucous membranes are moist.  Eyes:     General: No scleral icterus.       Right eye: No discharge.        Left eye: No discharge.     Conjunctiva/sclera: Conjunctivae normal.  Neck:     Musculoskeletal: Neck supple. No neck rigidity.  Cardiovascular:     Rate and Rhythm: Normal rate and regular rhythm.     Heart sounds: No murmur.  Pulmonary:     Effort: Pulmonary effort is normal.     Breath sounds: Normal breath sounds.  Abdominal:     General: Bowel sounds are normal. There is no distension.     Palpations: Abdomen is soft. There is no mass.     Tenderness: There is no abdominal tenderness. There is no guarding or rebound.     Hernia: No hernia is present.  Musculoskeletal: Normal range of motion.  Lymphadenopathy:     Cervical: No cervical adenopathy.  Skin:    General: Skin is warm and dry.     Coloration: Skin is not jaundiced.     Findings: No rash.  Neurological:     Mental Status: he is alert and oriented to person, place, and time.     Gait: Gait normal.  Psychiatric:        Mood and Affect: Mood normal.        Behavior: Behavior normal.        Thought Content: Thought content normal.        Judgment: Judgment normal.   UC Treatments / Results  Labs (all labs ordered are listed, but only abnormal results are displayed) Labs Reviewed  POC INFLUENZA A AND B ANTIGEN (URGENT CARE ONLY)  Flu A&B neg  EKG  Radiology DG Chest 2 View  Result Date: 02/08/2021 CLINICAL DATA:  Nonproductive cough and  chills. EXAM: CHEST - 2 VIEW COMPARISON:  July 23, 2006 FINDINGS: Mild atelectasis and/or infiltrate is seen within the infrahilar region on the right. There is no evidence of a pleural effusion or pneumothorax. The heart size and mediastinal contours are within normal limits. The visualized skeletal structures are unremarkable. IMPRESSION: Mild right infrahilar atelectasis and/or infiltrate. Electronically Signed   By: Aram Candela M.D.   On: 02/08/2021 19:27    Procedures Procedures (including critical care time)  Medications Ordered in UC Medications  acetaminophen (TYLENOL) tablet 975 mg (975 mg Oral Given 02/08/21 1920)    Initial Impression / Assessment and Plan / UC Course  I have reviewed the triage vital signs and the nursing notes. Pertinent labs & imaging results that were available during my care of the patient were reviewed by me and considered in my medical decision making (see chart for details). He was given Tylenol 950 mg PO while here Has RLL pneumonia I placed him on Augmentin and Tessalon as noted. See instructions.     Final Clinical Impressions(s) / UC Diagnoses   Final diagnoses:  Pneumonia of right lower lobe due to infectious organism     Discharge Instructions      Your flu test is negative The chest xray shows pneumonia in your right lower lung, please make sure to have a repeated chest xray in 6-8 weeks with your primary care provider to make sure this has resolved.      ED Prescriptions     Medication Sig Dispense Auth. Provider   benzonatate (TESSALON) 200 MG capsule Take 1 capsule (200 mg total) by mouth 3 (three) times daily as needed for cough. 30 capsule Rodriguez-Southworth, Gautam Langhorst, PA-C   amoxicillin-clavulanate (AUGMENTIN) 875-125 MG tablet Take 1 tablet by mouth every 12 (twelve) hours. 20 tablet Rodriguez-Southworth, Nettie Elm, PA-C      PDMP not reviewed this encounter.   Rodriguez-Southworth, Nettie Elm, PA-C 02/08/21 2001     Rodriguez-Southworth, [address], PA-C 02/08/21 2001

## 2021-06-02 ENCOUNTER — Ambulatory Visit (HOSPITAL_COMMUNITY)
Admission: EM | Admit: 2021-06-02 | Discharge: 2021-06-02 | Disposition: A | Discharge status: Home / Self Care | Payer: BC Managed Care – PPO | Attending: Internal Medicine | Admitting: Internal Medicine

## 2021-06-02 ENCOUNTER — Encounter (HOSPITAL_COMMUNITY): Payer: Self-pay

## 2021-06-02 DIAGNOSIS — L2389 Allergic contact dermatitis due to other agents: Secondary | ICD-10-CM | POA: Diagnosis not present

## 2021-06-02 MED ORDER — PREDNISONE 10 MG (21) PO TBPK: ORAL_TABLET | Freq: Every day | ORAL | 0 refills | Status: AC

## 2021-06-02 NOTE — ED Triage Notes (Signed)
Pt presents with c/o poison ivy. Pt states he noticed it 3 days ago.  ?

## 2021-06-02 NOTE — ED Provider Notes (Signed)
?[facility] ? ? ? ?CSN: [id number] ?Arrival date & time: 06/02/21  1719 ? ? ?  ? ?History   ?Chief Complaint ?Chief Complaint  ?Patient presents with  ? Poison Ivy  ? ? ?HPI ?Charles Harding is a 56 y.o. male.  ? ?Patient presents with rash to right arm, right lower abdomen, right thigh that has been present for approximately 3 days after being exposed to poison ivy.  Denies fevers, body aches, chills.  Patient has used Loss adjuster, chartered and Hydrocortisone Cream with no improvement. ? ? ?Poison Ivy ? ? ?Past Medical History:  ?Diagnosis Date  ? Dyslipidemia   ? Hypertension   ? Stroke [facility])   ? 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 ? ?Patient Active Problem List  ? Diagnosis Date Noted  ? Hyperlipidemia LDL goal <70 04/09/2014  ? CVA (cerebral infarction) 04/07/2014  ? Tobacco abuse 04/07/2014  ? Elevated blood pressure 04/07/2014  ? ? ?History reviewed. No pertinent surgical history. ? ? ? ? ?Home Medications   ? ?Prior to Admission medications   ?Medication Sig Start Date End Date Taking? Authorizing Provider  ?predniSONE (STERAPRED UNI-PAK 21 TAB) 10 MG (21) TBPK tablet Take by mouth daily. Take 6 tabs by mouth daily  for 2 days, then 5 tabs for 2 days, then 4 tabs for 2 days, then 3 tabs for 2 days, 2 tabs for 2 days, then 1 tab by mouth daily for 2 days 06/02/21  Yes Srihaan Mastrangelo, Rolly Salter E, FNP  ?amLODipine (NORVASC) 5 MG tablet Take 1 tablet (5 mg total) by mouth daily. 05/10/14   York Spaniel, MD  ?amoxicillin-clavulanate (AUGMENTIN) 875-125 MG tablet Take 1 tablet by mouth every 12 (twelve) hours. 02/08/21   Rodriguez-Southworth, Nettie Elm, PA-C  ?aspirin EC 325 MG EC tablet Take 1 tablet (325 mg total) by mouth daily. 04/11/14   Rinehuls, Kinnie Scales, PA-C  ?atorvastatin (LIPITOR) 40 MG tablet Take 1 tablet (40 mg total) by mouth daily at 6 PM. 05/10/14   York Spaniel, MD  ?benzonatate (TESSALON) 200 MG capsule Take 1 capsule (200 mg total) by mouth 3 (three) times daily as needed for cough. 02/08/21   Rodriguez-Southworth, Nettie Elm,  PA-C  ?trimethoprim-polymyxin b (POLYTRIM) ophthalmic solution Place 2 drops into the left eye every 6 (six) hours. 09/02/16   [name], PA-C  ? ? ?Family History ?Family History  ?Problem Relation Age of Onset  ? Diabetes Mother   ? Hypertension Mother   ? Stroke Mother   ? Heart disease Mother   ? Hyperlipidemia Father   ? Hypertension Father   ? Healthy Sister   ? Healthy Brother   ? Healthy Sister   ? ? ?Social History ?Social History  ? ?Tobacco Use  ? Smoking status: Former  ?  Packs/day: 0.00  ?  Types: Cigarettes  ?  Quit date: 04/07/2014  ?  Years since quitting: 7.1  ? Smokeless tobacco: Never  ?Substance Use Topics  ? Alcohol use: Yes  ?  Alcohol/week: 0.0 standard drinks  ?  Comment: occasional social drinker  ? Drug use: No  ? ? ? ?Allergies   ?Patient has no known allergies. ? ? ?Review of Systems ?Review of Systems ?Per HPI ? ?Physical Exam ?Triage Vital Signs ?ED Triage Vitals  ?Enc Vitals Group  ?   BP 06/02/21 1748 (!) 161/94  ?   Pulse Rate 06/02/21 1748 82  ?   Resp 06/02/21 1748 17  ?   Temp 06/02/21 1757 98.3 ?F (36.8 ?  C)  ?   Temp Source 06/02/21 1757 Oral  ?   SpO2 06/02/21 1748 95 %  ?   Weight --   ?   Height --   ?   Head Circumference --   ?   Peak Flow --   ?   Pain Score 06/02/21 1747 0  ?   Pain Loc --   ?   Pain Edu? --   ?   Excl. in GC? --   ? ?No data found. ? ?Updated Vital Signs ?BP (!) 161/94 (BP Location: Right Arm)   Pulse 84   Temp 98.3 ?F (36.8 ?C) (Oral)   Resp 17   SpO2 94%  ? ?Visual Acuity ?Right Eye Distance:   ?Left Eye Distance:   ?Bilateral Distance:   ? ?Right Eye Near:   ?Left Eye Near:    ?Bilateral Near:    ? ?Physical Exam ?Constitutional:   ?   General: He is not in acute distress. ?   Appearance: Normal appearance. He is not toxic-appearing or diaphoretic.  ?HENT:  ?   Head: Normocephalic and atraumatic.  ?Eyes:  ?   Extraocular Movements: Extraocular movements intact.  ?   Conjunctiva/sclera: Conjunctivae normal.  ?Pulmonary:  ?   Effort: Pulmonary  effort is normal.  ?Skin: ?   Findings: Rash present.  ?   Comments: Diffuse macular papular rash present to right forearm, right lower abdomen, right thigh.  No signs of bacterial infection.  No purulent drainage.  ?Neurological:  ?   General: No focal deficit present.  ?   Mental Status: He is alert and oriented to person, place, and time. Mental status is at baseline.  ?Psychiatric:     ?   Mood and Affect: Mood normal.     ?   Behavior: Behavior normal.     ?   Thought Content: Thought content normal.     ?   Judgment: Judgment normal.  ? ? ? ?UC Treatments / Results  ?Labs ?(all labs ordered are listed, but only abnormal results are displayed) ?Labs Reviewed - No data to display ? ?EKG ? ? ?Radiology ?No results found. ? ?Procedures ?Procedures (including critical care time) ? ?Medications Ordered in UC ?Medications - No data to display ? ?Initial Impression / Assessment and Plan / UC Course  ?I have reviewed the triage vital signs and the nursing notes. ? ?Pertinent labs & imaging results that were available during my care of the patient were reviewed by me and considered in my medical decision making (see chart for details). ? ?  ? ?Will treat contact dermatitis with prednisone as patient reports that he does not take any daily medications at all. No other contraindications noted in patient's chart.  No signs of bacterial infection.  Discussed return precautions.  Patient verbalized understanding and was agreeable with plan. ?Final Clinical Impressions(s) / UC Diagnoses  ? ?Final diagnoses:  ?Allergic contact dermatitis due to other agents  ? ? ? ?Discharge Instructions   ? ?  ?You have been prescribed prednisone steroid to help alleviate your allergic reaction.  Please follow-up if symptoms persist or worsen. ? ? ? ?ED Prescriptions   ? ? Medication Sig Dispense Auth. Provider  ? predniSONE (STERAPRED UNI-PAK 21 TAB) 10 MG (21) TBPK tablet Take by mouth daily. Take 6 tabs by mouth daily  for 2 days, then 5  tabs for 2 days, then 4 tabs for 2 days, then 3 tabs for 2 days, 2  tabs for 2 days, then 1 tab by mouth daily for 2 days 42 tablet [place], Acie Fredrickson, [place]  ? ?  ? ?PDMP not reviewed this encounter. ?  ?Gustavus Bryant, [place] ?06/02/21 1817 ? ?

## 2021-06-02 NOTE — Discharge Instructions (Signed)
You have been prescribed prednisone steroid to help alleviate your allergic reaction.  Please follow-up if symptoms persist or worsen. ?

## 2022-12-05 ENCOUNTER — Ambulatory Visit
Admission: EM | Admit: 2022-12-05 | Discharge: 2022-12-05 | Disposition: A | Discharge status: Home / Self Care | Payer: BC Managed Care – PPO | Attending: Internal Medicine | Admitting: Internal Medicine

## 2022-12-05 DIAGNOSIS — J019 Acute sinusitis, unspecified: Secondary | ICD-10-CM

## 2022-12-05 MED ORDER — AMOXICILLIN-POT CLAVULANATE 875-125 MG PO TABS: 1.0000 | ORAL_TABLET | Freq: Two times a day (BID) | ORAL | 0 refills | Status: AC

## 2022-12-05 MED ORDER — CETIRIZINE HCL 10 MG PO TABS: 10.0000 mg | ORAL_TABLET | Freq: Every day | ORAL | 0 refills | Status: AC

## 2022-12-05 MED ORDER — IPRATROPIUM BROMIDE 0.03 % NA SOLN: 2.0000 | Freq: Two times a day (BID) | NASAL | 0 refills | Status: AC

## 2022-12-05 NOTE — ED Provider Notes (Signed)
[facility]    CSN: [id number] Arrival date & time: 12/05/22  1516      History   Chief Complaint Chief Complaint  Patient presents with   Nasal Congestion    HPI Yuepheng Schleeter is a 57 y.o. male  presents for evaluation of URI symptoms for 30 days. Patient reports associated symptoms of sinus congestion/pressure. Denies N/V/D, cough, fevers, sore throat, ear pain, body aches, shortness of breath. Patient does not have a hx of asthma. Patient does not have a history of smoking.    Pt has taken Mucinex OTC for symptoms. Pt has no other concerns at this time.   HPI  Past Medical History:  Diagnosis Date   Dyslipidemia    Hypertension    Stroke [facility])    TIA 2/16    Patient Active Problem List   Diagnosis Date Noted   Hyperlipidemia LDL goal <70 04/09/2014   Cerebral infarction (HCC) 04/07/2014   Tobacco abuse 04/07/2014   Elevated blood pressure 04/07/2014    History reviewed. No pertinent surgical history.     Home Medications    Prior to Admission medications   Medication Sig Start Date End Date Taking? Authorizing Provider  amoxicillin-clavulanate (AUGMENTIN) 875-125 MG tablet Take 1 tablet by mouth every 12 (twelve) hours. 12/05/22  Yes Radford Pax, NP  cetirizine (ZYRTEC) 10 MG tablet Take 1 tablet (10 mg total) by mouth daily for 14 days. 12/05/22 12/19/22 Yes Radford Pax, NP  ipratropium (ATROVENT) 0.03 % nasal spray Place 2 sprays into both nostrils every 12 (twelve) hours. 12/05/22  Yes Radford Pax, NP  amLODipine (NORVASC) 5 MG tablet Take 1 tablet (5 mg total) by mouth daily. 05/10/14   York Spaniel, MD  aspirin EC 325 MG EC tablet Take 1 tablet (325 mg total) by mouth daily. 04/11/14   Rinehuls, Kinnie Scales, PA-C  atorvastatin (LIPITOR) 40 MG tablet Take 1 tablet (40 mg total) by mouth daily at 6 PM. 05/10/14   York Spaniel, MD  benzonatate (TESSALON) 200 MG capsule Take 1 capsule (200 mg total) by mouth 3 (three) times daily as needed  for cough. 02/08/21   Rodriguez-Southworth, Nettie Elm, PA-C  predniSONE (STERAPRED UNI-PAK 21 TAB) 10 MG (21) TBPK tablet Take by mouth daily. Take 6 tabs by mouth daily  for 2 days, then 5 tabs for 2 days, then 4 tabs for 2 days, then 3 tabs for 2 days, 2 tabs for 2 days, then 1 tab by mouth daily for 2 days 06/02/21   Gustavus Bryant, FNP  trimethoprim-polymyxin b (POLYTRIM) ophthalmic solution Place 2 drops into the left eye every 6 (six) hours. 09/02/16   [name], PA-C    Family History Family History  Problem Relation Age of Onset   Diabetes Mother    Hypertension Mother    Stroke Mother    Heart disease Mother    Hyperlipidemia Father    Hypertension Father    Healthy Sister    Healthy Brother    Healthy Sister     Social History Social History   Tobacco Use   Smoking status: Former    Current packs/day: 0.00    Types: Cigarettes    Quit date: 04/07/2014    Years since quitting: 8.6   Smokeless tobacco: Never  Substance Use Topics   Alcohol use: Yes    Alcohol/week: 0.0 standard drinks of alcohol    Comment: occasional social drinker   Drug use: No  Allergies   Patient has no known allergies.   Review of Systems Review of Systems  HENT:  Positive for congestion and sinus pressure.      Physical Exam Triage Vital Signs ED Triage Vitals  Encounter Vitals Group     BP 12/05/22 1526 (!) 182/104     Systolic BP Percentile --      Diastolic BP Percentile --      Pulse Rate 12/05/22 1526 90     Resp 12/05/22 1526 16     Temp 12/05/22 1526 97.8 F (36.6 C)     Temp Source 12/05/22 1526 Oral     SpO2 12/05/22 1526 94 %     Weight --      Height --      Head Circumference --      Peak Flow --      Pain Score 12/05/22 1525 0     Pain Loc --      Pain Education --      Exclude from Growth Chart --    No data found.  Updated Vital Signs BP (!) 182/104 (BP Location: Right Arm)   Pulse 90   Temp 97.8 F (36.6 C) (Oral)   Resp 16   SpO2 94%    Visual Acuity Right Eye Distance:   Left Eye Distance:   Bilateral Distance:    Right Eye Near:   Left Eye Near:    Bilateral Near:     Physical Exam Vitals and nursing note reviewed.  Constitutional:      General: He is not in acute distress.    Appearance: Normal appearance. He is not ill-appearing or toxic-appearing.  HENT:     Head: Normocephalic and atraumatic.     Right Ear: Tympanic membrane and ear canal normal.     Left Ear: Tympanic membrane and ear canal normal.     Nose: Congestion present.     Right Turbinates: Swollen and pale.     Left Turbinates: Swollen and pale.     Right Sinus: No maxillary sinus tenderness or frontal sinus tenderness.     Left Sinus: No maxillary sinus tenderness or frontal sinus tenderness.     Mouth/Throat:     Mouth: Mucous membranes are moist.     Pharynx: No oropharyngeal exudate or posterior oropharyngeal erythema.  Eyes:     Pupils: Pupils are equal, round, and reactive to light.  Cardiovascular:     Rate and Rhythm: Normal rate and regular rhythm.     Heart sounds: Normal heart sounds.  Pulmonary:     Effort: Pulmonary effort is normal.     Breath sounds: Normal breath sounds.  Musculoskeletal:     Cervical back: Normal range of motion and neck supple.  Lymphadenopathy:     Cervical: No cervical adenopathy.  Skin:    General: Skin is warm and dry.  Neurological:     General: No focal deficit present.     Mental Status: He is alert and oriented to person, place, and time.  Psychiatric:        Mood and Affect: Mood normal.        Behavior: Behavior normal.      UC Treatments / Results  Labs (all labs ordered are listed, but only abnormal results are displayed) Labs Reviewed - No data to display  EKG   Radiology No results found.  Procedures Procedures (including critical care time)  Medications Ordered in UC Medications - No data to display  Initial Impression / Assessment and Plan / UC Course  I have  reviewed the triage vital signs and the nursing notes.  Pertinent labs & imaging results that were available during my care of the patient were reviewed by me and considered in my medical decision making (see chart for details).     Reviewed exam and symptoms with patient.  No red flags.  Given length of symptoms we will start Augmentin.  Atrovent nasal spray twice daily for congestion.  Also start cetirizine daily for the next 14 days.  Lots of rest and fluids.  PCP follow-up as symptoms do not improve.  ER precautions reviewed. Final Clinical Impressions(s) / UC Diagnoses   Final diagnoses:  Acute sinusitis, recurrence not specified, unspecified location     Discharge Instructions      Start Augmentin daily for 7 days.  You may use Atrovent nasal spray twice daily for congestion.  Also start cetirizine allergy medicine daily for the next 14 days.  Lots of rest and fluids.  Please follow-up with your PCP if your symptoms do not improve.  Please go to the ER for any worsening symptoms.  I hope you feel better soon!    ED Prescriptions     Medication Sig Dispense Auth. Provider   cetirizine (ZYRTEC) 10 MG tablet Take 1 tablet (10 mg total) by mouth daily for 14 days. 14 tablet Radford Pax, NP   amoxicillin-clavulanate (AUGMENTIN) 875-125 MG tablet Take 1 tablet by mouth every 12 (twelve) hours. 14 tablet Cheri Rous R, NP   ipratropium (ATROVENT) 0.03 % nasal spray Place 2 sprays into both nostrils every 12 (twelve) hours. 30 mL Radford Pax, NP      PDMP not reviewed this encounter.   Radford Pax, NP 12/05/22 [phone]

## 2022-12-05 NOTE — Discharge Instructions (Addendum)
Start Augmentin daily for 7 days.  You may use Atrovent nasal spray twice daily for congestion.  Also start cetirizine allergy medicine daily for the next 14 days.  Lots of rest and fluids.  Please follow-up with your PCP if your symptoms do not improve.  Please go to the ER for any worsening symptoms.  I hope you feel better soon!

## 2022-12-05 NOTE — ED Triage Notes (Signed)
Pt presents to UC w/ c/o nasal congestion x1 month. Pt taking mucinex w/o relief.

## 2023-03-27 ENCOUNTER — Ambulatory Visit
Admission: EM | Admit: 2023-03-27 | Discharge: 2023-03-27 | Disposition: A | Discharge status: Home / Self Care | Payer: BC Managed Care – PPO | Attending: Family Medicine | Admitting: Family Medicine

## 2023-03-27 DIAGNOSIS — J101 Influenza due to other identified influenza virus with other respiratory manifestations: Secondary | ICD-10-CM | POA: Diagnosis not present

## 2023-03-27 LAB — POC COVID19/FLU A&B COMBO
Covid Antigen, POC: NEGATIVE
Influenza A Antigen, POC: POSITIVE — AB
Influenza B Antigen, POC: NEGATIVE

## 2023-03-27 MED ORDER — BENZONATATE 200 MG PO CAPS: 200.0000 mg | ORAL_CAPSULE | Freq: Three times a day (TID) | ORAL | 0 refills | Status: AC | PRN

## 2023-03-27 MED ORDER — ACETAMINOPHEN 325 MG PO TABS
650.0000 mg | ORAL_TABLET | Freq: Once | ORAL | Status: AC
  Administered 2023-03-27: 650 mg via ORAL

## 2023-03-27 MED ORDER — FLUTICASONE PROPIONATE 50 MCG/ACT NA SUSP: 1.0000 | Freq: Every day | NASAL | 0 refills | Status: AC

## 2023-03-27 NOTE — ED Triage Notes (Signed)
Pt present with c/o sinus congestion and cough x 4 days. Pt states he had a fever and took ibuprofen.   Denies other sxs

## 2023-03-27 NOTE — Discharge Instructions (Addendum)
You have tested positive for influenza A.  This is a viral illness.  Please continue Tylenol or ibuprofen to help manage your fever.  Take Tessalon 3 times a day as needed for your cough.  Flonase daily to help with your runny nose.  Lots of fluids and rest.  Please take your blood pressure medication as soon as you get home and keep an eye on your blood pressure to make sure it comes down.  Please follow-up with your PCP in 2 days for recheck.  Please go to the ER if you develop any worsening symptoms.  I hope you feel better soon!

## 2023-03-27 NOTE — ED Provider Notes (Signed)
[facility]    CSN: [id number] Arrival date & time: 03/27/23  1547      History   Chief Complaint No chief complaint on file.   HPI Charles Harding is a 58 y.o. male  presents for evaluation of URI symptoms for 4 days. Patient reports associated symptoms of cough, congestion, fevers. Denies N/V/D, ear pain, sore throat, body aches, shortness of breath. Patient does not in have a hx of asthma. Patient is not an active smoker.   Reports no sick contacts.  Pt has taken Mucinex and decongestants OTC for symptoms. Pt has no other concerns at this time.   HPI  Past Medical History:  Diagnosis Date   Dyslipidemia    Hypertension    Stroke [facility])    TIA 2/16    Patient Active Problem List   Diagnosis Date Noted   Hyperlipidemia LDL goal <70 04/09/2014   Cerebral infarction (HCC) 04/07/2014   Tobacco abuse 04/07/2014   Elevated blood pressure 04/07/2014    History reviewed. No pertinent surgical history.     Home Medications    Prior to Admission medications   Medication Sig Start Date End Date Taking? Authorizing Provider  benzonatate (TESSALON) 200 MG capsule Take 1 capsule (200 mg total) by mouth 3 (three) times daily as needed. 03/27/23  Yes Radford Pax, NP  fluticasone (FLONASE) 50 MCG/ACT nasal spray Place 1 spray into both nostrils daily. 03/27/23  Yes Radford Pax, NP  amLODipine (NORVASC) 5 MG tablet Take 1 tablet (5 mg total) by mouth daily. 05/10/14   York Spaniel, MD  amoxicillin-clavulanate (AUGMENTIN) 875-125 MG tablet Take 1 tablet by mouth every 12 (twelve) hours. 12/05/22   Radford Pax, NP  aspirin EC 325 MG EC tablet Take 1 tablet (325 mg total) by mouth daily. 04/11/14   Rinehuls, Kinnie Scales, PA-C  atorvastatin (LIPITOR) 40 MG tablet Take 1 tablet (40 mg total) by mouth daily at 6 PM. 05/10/14   York Spaniel, MD  cetirizine (ZYRTEC) 10 MG tablet Take 1 tablet (10 mg total) by mouth daily for 14 days. 12/05/22 12/19/22  Radford Pax, NP   ipratropium (ATROVENT) 0.03 % nasal spray Place 2 sprays into both nostrils every 12 (twelve) hours. 12/05/22   Radford Pax, NP  predniSONE (STERAPRED UNI-PAK 21 TAB) 10 MG (21) TBPK tablet Take by mouth daily. Take 6 tabs by mouth daily  for 2 days, then 5 tabs for 2 days, then 4 tabs for 2 days, then 3 tabs for 2 days, 2 tabs for 2 days, then 1 tab by mouth daily for 2 days 06/02/21   Gustavus Bryant, FNP  trimethoprim-polymyxin b (POLYTRIM) ophthalmic solution Place 2 drops into the left eye every 6 (six) hours. 09/02/16   [name], PA-C    Family History Family History  Problem Relation Age of Onset   Diabetes Mother    Hypertension Mother    Stroke Mother    Heart disease Mother    Hyperlipidemia Father    Hypertension Father    Healthy Sister    Healthy Brother    Healthy Sister     Social History Social History   Tobacco Use   Smoking status: Former    Current packs/day: 0.00    Types: Cigarettes    Quit date: 04/07/2014    Years since quitting: 8.9   Smokeless tobacco: Never  Substance Use Topics   Alcohol use: Yes    Alcohol/week: 0.0  standard drinks of alcohol    Comment: occasional social drinker   Drug use: No     Allergies   Patient has no known allergies.   Review of Systems Review of Systems  Constitutional:  Positive for fever.  HENT:  Positive for congestion.   Respiratory:  Positive for cough.      Physical Exam Triage Vital Signs ED Triage Vitals [03/27/23 1643]  Encounter Vitals Group     BP (!) 191/106     Systolic BP Percentile      Diastolic BP Percentile      Pulse Rate (!) 120     Resp 15     Temp (!) 101.9 F (38.8 C)     Temp Source Oral     SpO2 92 %     Weight      Height      Head Circumference      Peak Flow      Pain Score 0     Pain Loc      Pain Education      Exclude from Growth Chart    No data found.  Updated Vital Signs BP (!) 187/117   Pulse (!) 120   Temp (!) 101.9 F (38.8 C) (Oral)   Resp  15   SpO2 92%   Visual Acuity Right Eye Distance:   Left Eye Distance:   Bilateral Distance:    Right Eye Near:   Left Eye Near:    Bilateral Near:     Physical Exam Vitals and nursing note reviewed.  Constitutional:      General: He is not in acute distress.    Appearance: Normal appearance. He is not ill-appearing or toxic-appearing.  HENT:     Head: Normocephalic and atraumatic.     Right Ear: Tympanic membrane and ear canal normal.     Left Ear: Tympanic membrane and ear canal normal.     Nose: Congestion and rhinorrhea present.     Right Sinus: No maxillary sinus tenderness or frontal sinus tenderness.     Left Sinus: No maxillary sinus tenderness or frontal sinus tenderness.     Mouth/Throat:     Mouth: Mucous membranes are moist.     Pharynx: No oropharyngeal exudate or posterior oropharyngeal erythema.  Eyes:     Pupils: Pupils are equal, round, and reactive to light.  Cardiovascular:     Rate and Rhythm: Regular rhythm. Tachycardia present.     Heart sounds: Normal heart sounds.     Comments: Tachycardic in setting of fever Pulmonary:     Effort: Pulmonary effort is normal.     Breath sounds: Normal breath sounds.  Musculoskeletal:     Cervical back: Normal range of motion and neck supple.  Lymphadenopathy:     Cervical: No cervical adenopathy.  Skin:    General: Skin is warm and dry.  Neurological:     General: No focal deficit present.     Mental Status: He is alert and oriented to person, place, and time.  Psychiatric:        Mood and Affect: Mood normal.        Behavior: Behavior normal.      UC Treatments / Results  Labs (all labs ordered are listed, but only abnormal results are displayed) Labs Reviewed  POC COVID19/FLU A&B COMBO - Abnormal; Notable for the following components:      Result Value   Influenza A Antigen, POC Positive (*)    All  other components within normal limits    EKG   Radiology No results  found.  Procedures Procedures (including critical care time)  Medications Ordered in UC Medications  acetaminophen (TYLENOL) tablet 650 mg (650 mg Oral Given 03/27/23 1649)    Initial Impression / Assessment and Plan / UC Course  I have reviewed the triage vital signs and the nursing notes.  Pertinent labs & imaging results that were available during my care of the patient were reviewed by me and considered in my medical decision making (see chart for details).     Pt given tylenol in clinic for fever.  Reviewed exam and symptoms with patient.  Positive influenza A.  Blood pressure remains elevated on recheck, patient has not taken any of his blood pressure medications today as well as he is also taking over-the-counter decongestants.  Advised him to stop OTC decongestants and to take his BP meds as soon as he gets home and to keep track of his blood pressure readings the rest of the night.  He denies any chest pain, shortness of breath, headache or visual changes or dizziness.  Will do Tessalon and Flonase.  Discussed fever reduction, hydration and rest.  PCP follow-up in 2 days for recheck.  ER precautions reviewed and patient verbalized understanding Final Clinical Impressions(s) / UC Diagnoses   Final diagnoses:  Influenza A     Discharge Instructions      You have tested positive for influenza A.  This is a viral illness.  Please continue Tylenol or ibuprofen to help manage your fever.  Take Tessalon 3 times a day as needed for your cough.  Flonase daily to help with your runny nose.  Lots of fluids and rest.  Please take your blood pressure medication as soon as you get home and keep an eye on your blood pressure to make sure it comes down.  Please follow-up with your PCP in 2 days for recheck.  Please go to the ER if you develop any worsening symptoms.  I hope you feel better soon!     ED Prescriptions     Medication Sig Dispense Auth. Provider   benzonatate (TESSALON) 200  MG capsule Take 1 capsule (200 mg total) by mouth 3 (three) times daily as needed. 20 capsule Radford Pax, NP   fluticasone (FLONASE) 50 MCG/ACT nasal spray Place 1 spray into both nostrils daily. 15.8 mL Radford Pax, NP      PDMP not reviewed this encounter.   Radford Pax, NP 03/27/23 [phone]
